# Patient Record
Sex: Male | Born: 1945 | Hispanic: No | Marital: Married | Smoking: Never smoker
Health system: Southern US, Community
[De-identification: ages and names within clinical notes are randomized; demographics above are authoritative.]

## PROBLEM LIST (undated history)

## (undated) DIAGNOSIS — R51 Headache: Secondary | ICD-10-CM

## (undated) DIAGNOSIS — K219 Gastro-esophageal reflux disease without esophagitis: Secondary | ICD-10-CM

## (undated) DIAGNOSIS — I10 Essential (primary) hypertension: Secondary | ICD-10-CM

## (undated) DIAGNOSIS — Z8489 Family history of other specified conditions: Secondary | ICD-10-CM

## (undated) DIAGNOSIS — E785 Hyperlipidemia, unspecified: Secondary | ICD-10-CM

## (undated) DIAGNOSIS — E119 Type 2 diabetes mellitus without complications: Secondary | ICD-10-CM

## (undated) DIAGNOSIS — M1990 Unspecified osteoarthritis, unspecified site: Secondary | ICD-10-CM

## (undated) DIAGNOSIS — R519 Headache, unspecified: Secondary | ICD-10-CM

## (undated) HISTORY — PX: OTHER SURGICAL HISTORY: SHX169

## (undated) HISTORY — DX: Hyperlipidemia, unspecified: E78.5

## (undated) HISTORY — DX: Essential (primary) hypertension: I10

## (undated) HISTORY — PX: CATARACT EXTRACTION W/ INTRAOCULAR LENS IMPLANT: SHX1309

## (undated) HISTORY — PX: VASECTOMY: SHX75

## (undated) HISTORY — PX: KNEE ARTHROSCOPY: SHX127

## (undated) HISTORY — DX: Type 2 diabetes mellitus without complications: E11.9

---

## 2009-12-25 ENCOUNTER — Emergency Department (HOSPITAL_COMMUNITY): Admission: EM | Admit: 2009-12-25 | Discharge: 2009-12-26 | Payer: Self-pay | Admitting: Emergency Medicine

## 2010-10-09 LAB — CBC
HCT: 48.4 % (ref 39.0–52.0)
Hemoglobin: 16.6 g/dL (ref 13.0–17.0)
MCHC: 34.2 g/dL (ref 30.0–36.0)
Platelets: 191 10*3/uL (ref 150–400)
RDW: 12.6 % (ref 11.5–15.5)
WBC: 7.2 10*3/uL (ref 4.0–10.5)

## 2010-10-09 LAB — URINE CULTURE
Colony Count: NO GROWTH
Culture: NO GROWTH

## 2010-10-09 LAB — COMPREHENSIVE METABOLIC PANEL
ALT: 79 U/L — ABNORMAL HIGH (ref 0–53)
AST: 62 U/L — ABNORMAL HIGH (ref 0–37)
CO2: 25 mEq/L (ref 19–32)
Creatinine, Ser: 1.35 mg/dL (ref 0.4–1.5)
GFR calc Af Amer: >60 mL/min (ref >60)
GFR calc non Af Amer: 53 mL/min — ABNORMAL LOW (ref >60)
Sodium: 131 mEq/L — ABNORMAL LOW (ref 135–145)
Total Protein: 8.2 g/dL (ref 6.0–8.3)

## 2010-10-09 LAB — DIFFERENTIAL
Basophils Absolute: 0 10*3/uL (ref 0.0–0.1)
Monocytes Absolute: 0.8 10*3/uL (ref 0.1–1.0)

## 2010-10-09 LAB — URINALYSIS, ROUTINE W REFLEX MICROSCOPIC
Hgb urine dipstick: NEGATIVE
Protein, ur: NEGATIVE mg/dL
Urobilinogen, UA: 0.2 mg/dL (ref 0.0–1.0)

## 2011-10-29 ENCOUNTER — Telehealth: Payer: Self-pay

## 2011-10-29 NOTE — Telephone Encounter (Signed)
Pt states he put in a request for his medical records to be picked - up and he has not heard anything please contact pt when this is done

## 2011-10-30 NOTE — Telephone Encounter (Signed)
Spoke with patient this morning. Requested records twice due to switching doctors. Will copy for pick up this afternoon and will call at [phone] when ready for pickup.

## 2014-12-02 ENCOUNTER — Encounter: Payer: Medicare Other | Attending: Family Medicine

## 2014-12-02 VITALS — Ht 69.5 in | Wt 280.1 lb

## 2014-12-02 DIAGNOSIS — Z713 Dietary counseling and surveillance: Secondary | ICD-10-CM | POA: Diagnosis not present

## 2014-12-02 DIAGNOSIS — E119 Type 2 diabetes mellitus without complications: Secondary | ICD-10-CM

## 2014-12-07 NOTE — Progress Notes (Signed)

## 2014-12-09 DIAGNOSIS — E119 Type 2 diabetes mellitus without complications: Secondary | ICD-10-CM | POA: Diagnosis not present

## 2014-12-09 NOTE — Progress Notes (Signed)

## 2014-12-29 NOTE — Progress Notes (Signed)
Patient was seen on 12/16/14 for the third of a series of three diabetes self-management courses at the [facility].   Kevin Jones. State the amount of activity recommended for healthy living . Describe activities suitable for individual needs . Identify ways to regularly incorporate activity into daily life . Identify barriers to activity and ways to over come these barriers  Identify diabetes medications being personally used and their primary action for lowering glucose and possible side effects . Describe role of stress on blood glucose and develop strategies to address psychosocial issues . Identify diabetes complications and ways to prevent them  Explain how to manage diabetes during illness . Evaluate success in meeting personal goal . Establish 2-3 goals that they will plan to diligently work on until they return for the  720-month follow-up visit  Goals:   I will count my carb choices at most meals and snacks  I will be active 45 minutes or more 6 times a week  I will take my diabetes medications as scheduled  I will eat less unhealthy fats by eating less   I will look at patterns in my record book at least 1 days a week  To help manage stress I will  talking at least 7 times a week  Your patient has identified these potential barriers to change:  Lack of k nowledge  Your patient has identified their diabetes self-care support plan as  [facility] Family Support Plan:  Attend Core 4 in 4 months

## 2015-01-06 ENCOUNTER — Encounter: Payer: Self-pay | Admitting: *Deleted

## 2015-01-06 ENCOUNTER — Encounter: Payer: Medicare Other | Attending: Family Medicine | Admitting: *Deleted

## 2015-01-06 VITALS — Ht 69.5 in | Wt 276.6 lb

## 2015-01-06 DIAGNOSIS — E119 Type 2 diabetes mellitus without complications: Secondary | ICD-10-CM | POA: Diagnosis not present

## 2015-01-06 DIAGNOSIS — Z713 Dietary counseling and surveillance: Secondary | ICD-10-CM | POA: Insufficient documentation

## 2015-01-06 NOTE — Patient Instructions (Signed)
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

## 2015-01-06 NOTE — Progress Notes (Signed)
Diabetes Self-Management Education  Visit Type:  Follow-up  Appt. Start Time: 1030 Appt. End Time: 1100  01/06/2015  Kevin Jones, identified by name and date of birth, is a 69 y.o. male with a diagnosis of Diabetes: Type 2.  Other people present during visit:  Patient   ASSESSMENT  Height 5' 9.5" (1.765 m), weight 276 lb 9.6 oz (125.465 kg). Body mass index is 40.27 kg/(m^2).    Subsequent Visit Information:  Since your last visit, have you continued or began the use of a meal plan?: Yes How many days a week are you following a meal plan?: 7 Since your last visit, have you continued or began to exercise on a consistent basis?: Yes How many days per week are you exercising or participating in a physicial activity for more than 20 minutes?: 4 Since your last visit have you continued or begun to take your medications as prescribed?: Yes Since your last visit have you experienced any weight changes?: Loss Weight Loss (lbs): 4 Since your last visit, are you checking your blood glucose at least once a day?: N/A (MD does not want him to check yet)  Psychosocial:     Patient Belief/Attitude about Diabetes: Motivated to manage diabetes Self-care barriers: None Self-management support: Family Other persons present: Patient Patient Concerns: Nutrition/Meal planning Special Needs: None Learning Readiness: Change in progress  Complications:   Last HgB A1C per patient/outside source: 6 mg/dL How often do you check your blood sugar?: 0 times/day (not testing)  Diet Intake:     Exercise:  Exercise: Light (walking / raking leaves) (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  Patient education plan per assessed needs and concerns is to attend individual  sessions for    Education Topics Reviewed with Patient Today:    Carbohydrate counting, Food label reading, portion sizes and measuring food.     Identified appropriate SMBG and/or A1C goals.            PATIENTS GOALS/Plan (Developed by the patient):  Nutrition: Follow meal plan discussed Physical Activity: Exercise 3-5 times per week Medications: take my medication as prescribed Monitoring : Not Applicable  Patient Self Evaluation of Goals - Patient rates self as meeting previously set goals:   Nutrition: >75% Physical Activity: >75% Medications: >75% Monitoring: Not Applicable Problem Solving: >75% Reducing Risk: >75% Health Coping: >75%   Plan:   Patient Instructions  Plan:  Aim for 3 Carb Choices per meal (45 grams) +/- 1 either way  Aim for 0-2 Carbs per snack if hungry  Include protein in moderation with your meals and snacks Consider reading food labels for Total Carbohydrate of foods 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 Expect positive outcomes  Education material provided: A1C conversion sheet  If problems or questions, patient to contact team via:  Phone and Email  Future DSME appointment: - 3-4 months

## 2015-04-20 ENCOUNTER — Encounter: Payer: Medicare Other | Attending: Family Medicine

## 2015-04-20 VITALS — Wt 262.6 lb

## 2015-04-20 DIAGNOSIS — E119 Type 2 diabetes mellitus without complications: Secondary | ICD-10-CM | POA: Diagnosis not present

## 2015-04-20 DIAGNOSIS — Z713 Dietary counseling and surveillance: Secondary | ICD-10-CM | POA: Insufficient documentation

## 2015-04-20 NOTE — Progress Notes (Signed)
Appt start time: 0900 end time:  1000.  Patient was seen on 04/20/2015 for a review of the series of three diabetes self-management courses at the [facility]. The following learning objectives were met by the patient during this class:  . Reviewed blood glucose monitoring and interpretation including the recommended target ranges and Hgb A1c.  . Reviewed on carb counting, importance of regularly scheduled meals/snacks, and meal planning.  . Reviewed the effects of physical activity on glucose levels and long-term glucose control.  Recommended goal of 150 minutes of physical activity/week. . Reviewed patient medications and discussed role of medication on blood glucose and possible side effects. . Discussed strategies to manage stress, psychosocial issues, and other obstacles to diabetes management. . Encouraged moderate weight reduction to improve glucose levels.   . Reviewed short-term complications: hyper- and hypo-glycemia.  Discussed causes, symptoms, and treatment options. . Reviewed prevention, detection, and treatment of long-term complications.  Discussed the role of prolonged elevated glucose levels on body systems.  Goals:  Follow Diabetes Meal Plan as instructed  Eat 3 meals and 2 snacks, every 3-5 hrs  Limit carbohydrate intake to 45 grams carbohydrate/meal Limit carbohydrate intake to 15 grams carbohydrate/snack Add lean protein foods to meals/snacks  Monitor glucose levels as instructed by your doctor  Aim for goal of 15-30 mins of physical activity daily as tolerated  Bring food record and glucose log to your next nutrition visit

## 2016-01-14 ENCOUNTER — Observation Stay (HOSPITAL_COMMUNITY)
Admission: EM | Admit: 2016-01-14 | Discharge: 2016-01-15 | Disposition: A | Discharge status: Home / Self Care | Payer: Medicare Other | Attending: Internal Medicine | Admitting: Internal Medicine

## 2016-01-14 ENCOUNTER — Other Ambulatory Visit: Payer: Self-pay

## 2016-01-14 ENCOUNTER — Encounter (HOSPITAL_COMMUNITY): Payer: Self-pay

## 2016-01-14 ENCOUNTER — Emergency Department (HOSPITAL_COMMUNITY): Payer: Medicare Other

## 2016-01-14 DIAGNOSIS — R0789 Other chest pain: Secondary | ICD-10-CM | POA: Diagnosis not present

## 2016-01-14 DIAGNOSIS — N183 Chronic kidney disease, stage 3 unspecified: Secondary | ICD-10-CM | POA: Diagnosis present

## 2016-01-14 DIAGNOSIS — Z7982 Long term (current) use of aspirin: Secondary | ICD-10-CM | POA: Diagnosis not present

## 2016-01-14 DIAGNOSIS — M1A9XX0 Chronic gout, unspecified, without tophus (tophi): Secondary | ICD-10-CM | POA: Diagnosis present

## 2016-01-14 DIAGNOSIS — Z7984 Long term (current) use of oral hypoglycemic drugs: Secondary | ICD-10-CM | POA: Insufficient documentation

## 2016-01-14 DIAGNOSIS — E785 Hyperlipidemia, unspecified: Secondary | ICD-10-CM | POA: Insufficient documentation

## 2016-01-14 DIAGNOSIS — E119 Type 2 diabetes mellitus without complications: Secondary | ICD-10-CM | POA: Diagnosis not present

## 2016-01-14 DIAGNOSIS — Z79899 Other long term (current) drug therapy: Secondary | ICD-10-CM | POA: Insufficient documentation

## 2016-01-14 DIAGNOSIS — R079 Chest pain, unspecified: Secondary | ICD-10-CM

## 2016-01-14 DIAGNOSIS — R0602 Shortness of breath: Secondary | ICD-10-CM | POA: Diagnosis not present

## 2016-01-14 DIAGNOSIS — I129 Hypertensive chronic kidney disease with stage 1 through stage 4 chronic kidney disease, or unspecified chronic kidney disease: Secondary | ICD-10-CM | POA: Insufficient documentation

## 2016-01-14 DIAGNOSIS — I10 Essential (primary) hypertension: Secondary | ICD-10-CM | POA: Diagnosis present

## 2016-01-14 LAB — CBC WITH DIFFERENTIAL/PLATELET
BASOS ABS: 0 10*3/uL (ref 0.0–0.1)
Basophils Relative: 0 %
Eosinophils Absolute: 0.5 10*3/uL (ref 0.0–0.7)
Eosinophils Relative: 5 %
HEMATOCRIT: 43.4 % (ref 39.0–52.0)
HEMOGLOBIN: 14.8 g/dL (ref 13.0–17.0)
LYMPHS ABS: 1.9 10*3/uL (ref 0.7–4.0)
LYMPHS PCT: 21 %
MCH: 29.8 pg (ref 26.0–34.0)
MCHC: 34.1 g/dL (ref 30.0–36.0)
MCV: 87.3 fL (ref 78.0–100.0)
Monocytes Absolute: 0.9 10*3/uL (ref 0.1–1.0)
Monocytes Relative: 10 %
NEUTROS ABS: 5.7 10*3/uL (ref 1.7–7.7)
NEUTROS PCT: 64 %
Platelets: 184 10*3/uL (ref 150–400)
RBC: 4.97 MIL/uL (ref 4.22–5.81)
RDW: 14.1 % (ref 11.5–15.5)
WBC: 8.9 10*3/uL (ref 4.0–10.5)

## 2016-01-14 LAB — BASIC METABOLIC PANEL WITH GFR
Anion gap: 12 (ref 5–15)
BUN: 35 mg/dL — ABNORMAL HIGH (ref 6–20)
CO2: 23 mmol/L (ref 22–32)
Calcium: 9.4 mg/dL (ref 8.9–10.3)
Chloride: 101 mmol/L (ref 101–111)
Creatinine, Ser: 1.36 mg/dL — ABNORMAL HIGH (ref 0.61–1.24)
GFR calc Af Amer: 60 mL/min — ABNORMAL LOW
GFR calc non Af Amer: 52 mL/min — ABNORMAL LOW
Glucose, Bld: 83 mg/dL (ref 65–99)
Potassium: 3.9 mmol/L (ref 3.5–5.1)
Sodium: 136 mmol/L (ref 135–145)

## 2016-01-14 LAB — TROPONIN I: Troponin I: <0.03 ng/mL (ref <0.031)

## 2016-01-14 MED ORDER — ACETAMINOPHEN 325 MG PO TABS
650.0000 mg | ORAL_TABLET | Freq: Once | ORAL | Status: AC
  Administered 2016-01-14: 650 mg via ORAL
  Filled 2016-01-14: qty 2

## 2016-01-14 MED ORDER — NITROGLYCERIN 0.4 MG SL SUBL
0.4000 mg | SUBLINGUAL_TABLET | SUBLINGUAL | Status: DC | PRN
  Administered 2016-01-14: 0.4 mg via SUBLINGUAL
  Filled 2016-01-14: qty 1

## 2016-01-14 MED ORDER — SODIUM CHLORIDE 0.9 % IV BOLUS (SEPSIS)
500.0000 mL | Freq: Once | INTRAVENOUS | Status: AC
  Administered 2016-01-14: 500 mL via INTRAVENOUS

## 2016-01-14 NOTE — ED Notes (Signed)
Per Pt he was sitting the computer about 1830 and had sudden onset of left side chest pain with radiation into left arm; pt denies n/v or diaphoriese; Pt states he did have a little bit of SOB at the same time; Pt rate pain at 5/10 that has stayed the same; pt has hx of DM, hyperlipidemia, and HTN; pt a&ox4 on arrival; pt is ambularoty on arrival. Md at bedside. Pt had 324 mg ASA and 1 nitro in route with not change in pain in chest but pain arm has decreased.

## 2016-01-14 NOTE — ED Notes (Signed)
Pt had to use the bathroom, prior to the EKG.

## 2016-01-14 NOTE — ED Provider Notes (Addendum)
CSN: [id number]     Arrival date & time    History   First MD Initiated Contact with Patient 01/14/16 2143     Chief Complaint  Patient presents with  . Chest Pain     (Consider location/radiation/quality/duration/timing/severity/associated sxs/prior Treatment) HPI  70 year old male presents with Left-sided chest pains. Started about 2 hours ago. Patient states he was sitting in his desk doing nothing special and all of a sudden developed the pain. Felt like an aching like he pulled a muscle. Patient states initially he had shortness of breath but that is gone. He also had aching pain in his left upper arm. He was given 324 mg aspirin and one nitroglycerin. Arm pain is gone but his chest pain is still a 5/10. No pleuritic pain. No back pain or abdominal pain.  Past Medical History  Diagnosis Date  . Hypertension   . Hyperlipidemia   . Diabetes mellitus without complication    No past surgical history on file. No family history on file. Social History  Substance Use Topics  . Smoking status: Not on file  . Smokeless tobacco: Not on file  . Alcohol Use: Not on file    Review of Systems  Constitutional: Negative for fever and diaphoresis.  Respiratory: Positive for shortness of breath.   Cardiovascular: Positive for chest pain.  Gastrointestinal: Negative for nausea and vomiting.  Musculoskeletal:       Left upper arm pain  All other systems reviewed and are negative.     Allergies  Compazine; Sulfa antibiotics; and Sulfazine  Home Medications   Prior to Admission medications   Medication Sig Start Date End Date Taking? Authorizing Provider  allopurinol (ZYLOPRIM) 300 MG tablet Take 450 mg by mouth daily.    Historical Provider, MD  amLODipine (NORVASC) 10 MG tablet Take 10 mg by mouth daily.    Historical Provider, MD  aspirin 81 MG tablet Take 81 mg by mouth daily.    Historical Provider, MD  atenolol (TENORMIN) 50 MG tablet Take 50 mg by mouth daily.    Historical  Provider, MD  lisinopril-hydrochlorothiazide (PRINZIDE,ZESTORETIC) 20-12.5 MG per tablet Take 1 tablet by mouth daily.    Historical Provider, MD  metFORMIN (GLUCOPHAGE) 500 MG tablet Take 500 mg by mouth 2 (two) times daily with a meal.    Historical Provider, MD  simvastatin (ZOCOR) 10 MG tablet Take 10 mg by mouth daily.    Historical Provider, MD   BP 158/79 mmHg  Pulse 80  Temp(Src) 97.7 F (36.5 C) (Oral)  Resp 16  SpO2 95% Physical Exam  Constitutional: He is oriented to person, place, and time. He appears well-developed and well-nourished. No distress.  HENT:  Head: Normocephalic and atraumatic.  Right Ear: External ear normal.  Left Ear: External ear normal.  Nose: Nose normal.  Eyes: Right eye exhibits no discharge. Left eye exhibits no discharge.  Neck: Neck supple.  Cardiovascular: Normal rate, regular rhythm, normal heart sounds and intact distal pulses.   Pulses:      Radial pulses are 2+ on the right side, and 2+ on the left side.  Pulmonary/Chest: Effort normal and breath sounds normal. He exhibits no tenderness.  Abdominal: Soft. He exhibits no distension. There is no tenderness.  Musculoskeletal: He exhibits no edema.  Neurological: He is alert and oriented to person, place, and time.  Skin: Skin is warm and dry. He is not diaphoretic.  Nursing note and vitals reviewed.   ED Course  Procedures (including critical  care time) Labs Review Labs Reviewed  BASIC METABOLIC PANEL - Abnormal; Notable for the following:    BUN 35 (*)    Creatinine, Ser 1.36 (*)    GFR calc non Af Amer 52 (*)    GFR calc Af Amer 60 (*)    All other components within normal limits  CBC WITH DIFFERENTIAL/PLATELET  TROPONIN I    Imaging Review Dg Chest 2 View  01/14/2016  CLINICAL DATA:  Left-sided chest pain and shortness of breath EXAM: CHEST  2 VIEW COMPARISON:  None. FINDINGS: Mild opacity in left lung base is somewhat platelike suggesting atelectasis. No pneumothorax. No  pulmonary nodules or masses. No other infiltrates. The heart, hila, and mediastinum are unremarkable. IMPRESSION: Mild opacity in the left lung base. Atelectasis is considered more likely than developing infiltrate. Recommend follow-up as clinically warranted. Electronically Signed   By: Gerome Samavid  Williams III M.D   On: 01/14/2016 22:44   I have personally reviewed and evaluated these images and lab results as part of my medical decision-making.   EKG Interpretation   Date/Time:  Saturday January 14 2016 21:48:46 EDT Ventricular Rate:  75 PR Interval:    QRS Duration: 139 QT Interval:  411 QTC Calculation: 460 R Axis:   -142 Text Interpretation:  Sinus rhythm Prolonged PR interval Right bundle  branch block No old tracing to compare Confirmed by Dayan Desa MD, Alva Broxson  [phone]([id number]) on 01/14/2016 9:56:41 PM      MDM   Final diagnoses:  Chest pain, unspecified chest pain type    Patient with atypical chest pain. Pain is now resolved, unclear if from nitro or on its own. ECG with RBBB with no old. Initial troponin negative. Doubt PE. HEART score is a 5. No cough, likely CXR findings is atelectasis. Will admit to hospitalist for ACS rule out.    Pricilla LovelessScott Bellami Farrelly, MD 01/14/16 [id number]  Pricilla LovelessScott Hiilani Jetter, MD 01/15/16 0040

## 2016-01-15 ENCOUNTER — Observation Stay (HOSPITAL_COMMUNITY): Payer: Medicare Other

## 2016-01-15 ENCOUNTER — Other Ambulatory Visit: Payer: Self-pay

## 2016-01-15 ENCOUNTER — Other Ambulatory Visit (HOSPITAL_COMMUNITY): Payer: Self-pay

## 2016-01-15 ENCOUNTER — Encounter (HOSPITAL_COMMUNITY): Payer: Self-pay | Admitting: Family Medicine

## 2016-01-15 DIAGNOSIS — E785 Hyperlipidemia, unspecified: Secondary | ICD-10-CM | POA: Diagnosis not present

## 2016-01-15 DIAGNOSIS — R079 Chest pain, unspecified: Secondary | ICD-10-CM

## 2016-01-15 DIAGNOSIS — M1A9XX0 Chronic gout, unspecified, without tophus (tophi): Secondary | ICD-10-CM | POA: Diagnosis present

## 2016-01-15 DIAGNOSIS — R0789 Other chest pain: Principal | ICD-10-CM

## 2016-01-15 DIAGNOSIS — N183 Chronic kidney disease, stage 3 (moderate): Secondary | ICD-10-CM | POA: Diagnosis not present

## 2016-01-15 DIAGNOSIS — M1040 Other secondary gout, unspecified site: Secondary | ICD-10-CM

## 2016-01-15 DIAGNOSIS — I10 Essential (primary) hypertension: Secondary | ICD-10-CM

## 2016-01-15 DIAGNOSIS — I129 Hypertensive chronic kidney disease with stage 1 through stage 4 chronic kidney disease, or unspecified chronic kidney disease: Secondary | ICD-10-CM | POA: Diagnosis not present

## 2016-01-15 DIAGNOSIS — E119 Type 2 diabetes mellitus without complications: Secondary | ICD-10-CM | POA: Diagnosis not present

## 2016-01-15 LAB — NM MYOCAR MULTI W/SPECT W/WALL MOTION / EF
CHL CUP RESTING HR STRESS: 62 {beats}/min
CSEPPHR: 81 {beats}/min

## 2016-01-15 LAB — GLUCOSE, CAPILLARY
GLUCOSE-CAPILLARY: 144 mg/dL — AB (ref 65–99)
Glucose-Capillary: 100 mg/dL — ABNORMAL HIGH (ref 65–99)

## 2016-01-15 LAB — BRAIN NATRIURETIC PEPTIDE: B Natriuretic Peptide: 17.7 pg/mL (ref 0.0–100.0)

## 2016-01-15 LAB — TROPONIN I
Troponin I: <0.03 ng/mL (ref <0.031)
Troponin I: <0.03 ng/mL (ref <0.031)
Troponin I: <0.03 ng/mL (ref <0.031)

## 2016-01-15 MED ORDER — ALLOPURINOL 300 MG PO TABS
450.0000 mg | ORAL_TABLET | Freq: Every day | ORAL | Status: DC
  Administered 2016-01-15: 450 mg via ORAL
  Filled 2016-01-15: qty 2

## 2016-01-15 MED ORDER — TECHNETIUM TC 99M TETROFOSMIN IV KIT
30.0000 | PACK | Freq: Once | INTRAVENOUS | Status: AC | PRN
  Administered 2016-01-15: 30 via INTRAVENOUS

## 2016-01-15 MED ORDER — ONDANSETRON HCL 4 MG/2ML IJ SOLN: 4.0000 mg | Freq: Four times a day (QID) | INTRAMUSCULAR | Status: DC | PRN

## 2016-01-15 MED ORDER — HYDROCHLOROTHIAZIDE 25 MG PO TABS
25.0000 mg | ORAL_TABLET | Freq: Every day | ORAL | Status: DC
  Administered 2016-01-15: 25 mg via ORAL
  Filled 2016-01-15: qty 1

## 2016-01-15 MED ORDER — NITROGLYCERIN 0.4 MG SL SUBL: 0.4000 mg | SUBLINGUAL_TABLET | SUBLINGUAL | Status: DC | PRN

## 2016-01-15 MED ORDER — ASPIRIN EC 81 MG PO TBEC
81.0000 mg | DELAYED_RELEASE_TABLET | Freq: Every day | ORAL | Status: DC
  Administered 2016-01-15: 81 mg via ORAL
  Filled 2016-01-15: qty 1

## 2016-01-15 MED ORDER — ATENOLOL 50 MG PO TABS
50.0000 mg | ORAL_TABLET | Freq: Every day | ORAL | Status: DC
  Filled 2016-01-15: qty 1

## 2016-01-15 MED ORDER — REGADENOSON 0.4 MG/5ML IV SOLN
INTRAVENOUS | Status: AC
  Filled 2016-01-15: qty 5

## 2016-01-15 MED ORDER — LISINOPRIL-HYDROCHLOROTHIAZIDE 20-25 MG PO TABS: 1.0000 | ORAL_TABLET | Freq: Every day | ORAL | Status: DC

## 2016-01-15 MED ORDER — FLUTICASONE PROPIONATE 50 MCG/ACT NA SUSP
1.0000 | Freq: Every day | NASAL | Status: DC
  Administered 2016-01-15: 1 via NASAL
  Filled 2016-01-15: qty 16

## 2016-01-15 MED ORDER — TECHNETIUM TC 99M TETROFOSMIN IV KIT
10.0000 | PACK | Freq: Once | INTRAVENOUS | Status: AC | PRN
  Administered 2016-01-15: 10 via INTRAVENOUS

## 2016-01-15 MED ORDER — ACETAMINOPHEN 325 MG PO TABS: 650.0000 mg | ORAL_TABLET | ORAL | Status: DC | PRN

## 2016-01-15 MED ORDER — IBUPROFEN 400 MG PO TABS: 400.0000 mg | ORAL_TABLET | Freq: Three times a day (TID) | ORAL | Status: DC

## 2016-01-15 MED ORDER — ENOXAPARIN SODIUM 40 MG/0.4ML ~~LOC~~ SOLN
40.0000 mg | SUBCUTANEOUS | Status: DC
  Administered 2016-01-15: 40 mg via SUBCUTANEOUS
  Filled 2016-01-15 (×2): qty 0.4

## 2016-01-15 MED ORDER — SIMVASTATIN 10 MG PO TABS: 10.0000 mg | ORAL_TABLET | Freq: Every day | ORAL | Status: DC

## 2016-01-15 MED ORDER — AMLODIPINE BESYLATE 5 MG PO TABS
10.0000 mg | ORAL_TABLET | Freq: Every day | ORAL | Status: DC
  Administered 2016-01-15: 10 mg via ORAL
  Filled 2016-01-15: qty 2

## 2016-01-15 MED ORDER — REGADENOSON 0.4 MG/5ML IV SOLN
0.4000 mg | Freq: Once | INTRAVENOUS | Status: AC
  Administered 2016-01-15: 0.4 mg via INTRAVENOUS

## 2016-01-15 MED ORDER — LISINOPRIL 20 MG PO TABS
20.0000 mg | ORAL_TABLET | Freq: Every day | ORAL | Status: DC
  Administered 2016-01-15: 20 mg via ORAL
  Filled 2016-01-15: qty 1

## 2016-01-15 NOTE — Progress Notes (Signed)
Attempted to get report. 

## 2016-01-15 NOTE — Consult Note (Signed)
CARDIOLOGY CONSULT NOTE       Patient ID: Kevin Jones MRN: [id number] DOB/AGE: 09/19/1945 70 y.o.  Admit date: 01/14/2016 Referring Physician: Butler Denmarkizwan Primary Physician: Kevin Jones, STEPHEN, Kevin Jones Primary Cardiologist: Kevin Jones Reason for Consultation: Chest Pain  Principal Problem:   Chest pain Active Problems:   Hypertension   Hyperlipidemia   CKD (chronic kidney disease) stage 3, GFR 30-59 ml/min   Chest pain at rest   Chronic gout   HPI:  70 y.o. HTN, elevated lipids and DM.  Admitted with new onset SSCP.  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 Yesterday multiple episodes of left sided pain. Pressure Radiating to left shoulder Related to exertion but lingered when stopped. This am left shoulder pain that sounds more mechanical. Compliant with meds. No history of CAD Had  Normal stress [place] 6530[place]  In ER CXR ? Atelectasis. No CE. Enzymes negative and ECG no acute changes  Did not sleep well due to  Pain in iv on left hand and left shoulder pain   ROS All other systems reviewed and negative except as noted above  Past Medical History  Diagnosis Date  . Hypertension   . Hyperlipidemia   . Diabetes mellitus without complication (HCC)     Family History  Problem Relation Age of Onset  . Cirrhosis Sister   . Lung cancer Sister     Social History   Social History  . Marital Status: Married    Spouse Name: N/A  . Number of Children: N/A  . Years of Education: N/A   Occupational History  . Not on file.   Social History Main Topics  . Smoking status: Never Smoker   . Smokeless tobacco: Not on file  . Alcohol Use: Yes  . Drug Use: Not on file  . Sexual Activity: Not on file   Other Topics Concern  . Not on file   Social History Narrative    History reviewed. No pertinent past surgical history.   Marland Kitchen. allopurinol  450 mg Oral Daily  . amLODipine  10 mg Oral Daily  . aspirin EC  81 mg Oral Daily  . enoxaparin  (LOVENOX) injection  40 mg Subcutaneous Q24H  . fluticasone  1 spray Each Nare QHS  . lisinopril  20 mg Oral Daily   And  . hydrochlorothiazide  25 mg Oral Daily  . simvastatin  10 mg Oral q1800      Physical Exam: Blood pressure 129/70, pulse 61, temperature 97.7 F (36.5 C), temperature source Oral, resp. rate 16, height 5\' 9"  (1.753 m), weight 259 lb 11.2 oz (117.799 kg), SpO2 98 %.    Affect appropriate Healthy:  appears stated age HEENT: normal Neck supple with no adenopathy JVP normal no bruits no thyromegaly Lungs clear with no wheezing and good diaphragmatic motion Heart:  S1/S2 no murmur, no rub, gallop or click PMI normal Abdomen: benighn, BS positve, no tenderness, no AAA no bruit.  No HSM or HJR Distal pulses intact with no bruits No edema Neuro non-focal Skin warm and dry No muscular weakness   Labs:   Lab Results  Component Value Date   WBC 8.9 01/14/2016   HGB 14.8 01/14/2016   HCT 43.4 01/14/2016   MCV 87.3 01/14/2016   PLT 184 01/14/2016    Recent Labs Lab 01/14/16 2153  NA 136  K 3.9  CL 101  CO2 23  BUN 35*  CREATININE 1.36*  CALCIUM 9.4  GLUCOSE 83   Lab Results  Component Value Date   TROPONINI <0.03 01/15/2016     Radiology: Dg Chest 2 View  01/14/2016  CLINICAL DATA:  Left-sided chest pain and shortness of breath EXAM: CHEST  2 VIEW COMPARISON:  None. FINDINGS: Mild opacity in left lung base is somewhat platelike suggesting atelectasis. No pneumothorax. No pulmonary nodules or masses. No other infiltrates. The heart, hila, and mediastinum are unremarkable. IMPRESSION: Mild opacity in the left lung base. Atelectasis is considered more likely than developing infiltrate. Recommend follow-up as clinically warranted. Electronically Signed   By: Gerome Samavid  Williams III M.D   On: 01/14/2016 22:44    EKG:  NSR no acute changes    ASSESSMENT AND PLAN:  Chest Pain: Intermediate risk R/O have scheduled exercise myovue for this am Hold  atenolol HTN:  Well controlled.  Continue current medications and low sodium Dash type diet.   Chol:  On statin  DM:  Discussed low carb diet.  Target hemoglobin A1c is 6.5 or less.  Continue current medications.   SignedCharlton Haws: Aubra Pappalardo 01/15/2016, 7:41 AM

## 2016-01-15 NOTE — H&P (Signed)
History and Physical    Kevin Jones RUE:[id number] DOB: 03-01-1946 DOA: 01/14/2016  PCP: Joycelyn RuaMEYERS, STEPHEN, MD   Patient coming from: Home   Chief Complaint: Chest pain   HPI: Kevin Jones is a 70 y.o. male with medical history significant for hypertension, hyperlipidemia, and prior history of type 2 diabetes mellitus now with normal A1c after lifestyle change, presenting to the ED with acute onset of left-sided chest pain with radiation to the left arm. Patient reports pain in his usual state of health on the night of his presentation and was sitting at a computer when he developed acute onset of pain in the left chest described as an ache, radiating down to the left arm, 5/10 in severity, associated with mild transient dyspnea, but no nausea or diaphoresis. Patient has never had similar symptoms previously, denies history of MI, and notes that he had a normal stress test approximately 9-10 years ago. Patient has never smoked, exercises daily, and denies family history of premature heart disease though his father died of trauma at early age and his 2 sisters also died relatively young of noncardiac causes. EMS was activated for transport to the hospital and a full dose aspirin and nitroglycerin were administered en route. Patient's pain had resolved prior to his arrival in the ED.  ED Course: Upon arrival to the ED, patient is found to be afebrile, saturating well on room air, and with vital signs stable. 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 There is no prior EKG for comparison. Chest x-ray features a mild opacity in the left base which likely represents atelectasis. BMP is notable for a BUN of 35 and serum creatinine 1.36 which appears stable relative to the prior measurement. CBC is unremarkable and initial troponin is <0.03. Patient has remained pain free in the emergency department, but given his age and comorbidity, including hypertension, hyperlipidemia, and prior  diabetes, he will be observed on telemetry unit for ongoing evaluation and management of chest pain concerning for possible ACS.  Review of Systems:  All other systems reviewed and apart from HPI, are negative.  Past Medical History  Diagnosis Date  . Hypertension   . Hyperlipidemia   . Diabetes mellitus without complication (HCC)     History reviewed. No pertinent past surgical history.   reports that he has never smoked. He does not have any smokeless tobacco history on file. He reports that he drinks alcohol. His drug history is not on file.  Allergies  Allergen Reactions  . Compazine [Prochlorperazine Edisylate] Rash    Muscle contractions  . Sulfa Antibiotics Rash  . Sulfazine [Sulfasalazine] Rash    Family History  Problem Relation Age of Onset  . Cirrhosis Sister   . Lung cancer Sister      Prior to Admission medications   Medication Sig Start Date End Date Taking? Authorizing Provider  allopurinol (ZYLOPRIM) 300 MG tablet Take 450 mg by mouth daily.   Yes Historical Provider, MD  amLODipine (NORVASC) 10 MG tablet Take 10 mg by mouth daily.   Yes Historical Provider, MD  aspirin 81 MG tablet Take 81 mg by mouth every morning.    Yes Historical Provider, MD  atenolol (TENORMIN) 50 MG tablet Take 50 mg by mouth daily.   Yes Historical Provider, MD  fluticasone (FLONASE) 50 MCG/ACT nasal spray Place 1 spray into both nostrils at bedtime.  12/16/15  Yes Historical Provider, MD  lisinopril-hydrochlorothiazide (PRINZIDE,ZESTORETIC) 20-12.5 MG per tablet Take 1 tablet  by mouth daily.   Yes Historical Provider, MD  metFORMIN (GLUCOPHAGE) 500 MG tablet Take 500 mg by mouth 2 (two) times daily with a meal.   Yes Historical Provider, MD  simvastatin (ZOCOR) 10 MG tablet Take 10 mg by mouth daily at 6 PM.    Yes Historical Provider, MD  lisinopril-hydrochlorothiazide (PRINZIDE,ZESTORETIC) 20-25 MG tablet Take 1 tablet by mouth daily. 12/08/15   Historical Provider, MD    Physical  Exam: Filed Vitals:   01/14/16 2147 01/14/16 2153 01/14/16 2155 01/14/16 2200  BP:  158/79  142/64  Pulse:  80  71  Temp:  97.7 F (36.5 C)    TempSrc:  Oral    Resp:  16  14  SpO2: 94% 95% 96% 93%      Constitutional: NAD, calm, comfortable Eyes: PERTLA, lids and conjunctivae normal ENMT: Mucous membranes are moist. Posterior pharynx clear of any exudate or lesions.   Neck: normal, supple, no masses, no thyromegaly Respiratory: clear to auscultation bilaterally, no wheezing, no crackles. Normal respiratory effort.   Cardiovascular: S1 & S2 heard, regular rate and rhythm, no significant murmurs / rubs / gallops. No extremity edema. 2+ pedal pulses.  Abdomen: No distension, no tenderness, no masses palpated. Bowel sounds normal.  Musculoskeletal: no clubbing / cyanosis. No joint deformity upper and lower extremities. Normal muscle tone.  Skin: no significant rashes, lesions, ulcers. Warm, dry, well-perfused. Neurologic: CN 2-12 grossly intact. Sensation intact, DTR normal. Strength 5/5 in all 4 limbs.  Psychiatric: Normal judgment and insight. Alert and oriented x 3. Normal mood and affect.     Labs on Admission: I have personally reviewed following labs and imaging studies  CBC:  Recent Labs Lab 01/14/16 2153  WBC 8.9  NEUTROABS 5.7  HGB 14.8  HCT 43.4  MCV 87.3  PLT 184   Basic Metabolic Panel:  Recent Labs Lab 01/14/16 2153  NA 136  K 3.9  CL 101  CO2 23  GLUCOSE 83  BUN 35*  CREATININE 1.36*  CALCIUM 9.4   GFR: CrCl cannot be calculated (Unknown ideal weight.). Liver Function Tests: No results for input(s): AST, ALT, ALKPHOS, BILITOT, PROT, ALBUMIN in the last 168 hours. No results for input(s): LIPASE, AMYLASE in the last 168 hours. No results for input(s): AMMONIA in the last 168 hours. Coagulation Profile: No results for input(s): INR, PROTIME in the last 168 hours. Cardiac Enzymes:  Recent Labs Lab 01/14/16 2153  TROPONINI <0.03   BNP  (last 3 results) No results for input(s): PROBNP in the last 8760 hours. HbA1C: No results for input(s): HGBA1C in the last 72 hours. CBG: No results for input(s): GLUCAP in the last 168 hours. Lipid Profile: No results for input(s): CHOL, HDL, LDLCALC, TRIG, CHOLHDL, LDLDIRECT in the last 72 hours. Thyroid Function Tests: No results for input(s): TSH, T4TOTAL, FREET4, T3FREE, THYROIDAB in the last 72 hours. Anemia Panel: No results for input(s): VITAMINB12, FOLATE, FERRITIN, TIBC, IRON, RETICCTPCT in the last 72 hours. Urine analysis:    Component Value Date/Time   COLORURINE YELLOW 12/25/2009 2134   APPEARANCEUR CLOUDY* 12/25/2009 2134   LABSPEC 1.012 12/25/2009 2134   PHURINE 6.0 12/25/2009 2134   GLUCOSEU NEGATIVE 12/25/2009 2134   HGBUR NEGATIVE 12/25/2009 2134   BILIRUBINUR NEGATIVE 12/25/2009 2134   KETONESUR NEGATIVE 12/25/2009 2134   PROTEINUR NEGATIVE 12/25/2009 2134   UROBILINOGEN 0.2 12/25/2009 2134   NITRITE NEGATIVE 12/25/2009 2134   LEUKOCYTESUR  12/25/2009 2134    NEGATIVE MICROSCOPIC NOT DONE ON URINES  WITH NEGATIVE PROTEIN, BLOOD, LEUKOCYTES, NITRITE, OR GLUCOSE <1000 mg/dL.   Sepsis Labs: @LABRCNTIP (procalcitonin:4,lacticidven:4) )No results found for this or any previous visit (from the past 240 hour(s)).   Radiological Exams on Admission: Dg Chest 2 View  01/14/2016  CLINICAL DATA:  Left-sided chest pain and shortness of breath EXAM: CHEST  2 VIEW COMPARISON:  None. FINDINGS: Mild opacity in left lung base is somewhat platelike suggesting atelectasis. No pneumothorax. No pulmonary nodules or masses. No other infiltrates. The heart, hila, and mediastinum are unremarkable. IMPRESSION: Mild opacity in the left lung base. Atelectasis is considered more likely than developing infiltrate. Recommend follow-up as clinically warranted. Electronically Signed   By: Gerome Samavid  Williams III M.D   On: 01/14/2016 22:44    EKG: Independently reviewed. Sinus rhythm, first degree  AV block, RBBB, no prior for comparison   Assessment/Plan  1. Chest pain  - Atypical in that it came on at rest, but concerning in that it was relieved with NTG  - After admission, pt has noted that he can reproduce sxs with certain arm movements, suggesting a MSK etiology, though he has not done any new activities with the UE's  - Initial EKG features abnormalities that are likely chronic, but there is no prior for comparison; will repeat in am, sooner for CP recurrence  - Initial troponin <0.03, will obtain serial measurements  - Monitor on telemetry for ischemic changes  - ASA 324 mg given en route; continue his beta-blocker and statin    2. Hypertension  - BP at goal at time of admission  - Continue current management with lisinopril, HCTZ, Norvasc, atenolol   3. Hyperlipidemia  - No lipid profile in EMR  - Continue current management with Zocor    4. CKD stage III  - SCr 1.36, stable relative to prior measurement  - Avoid nephrotoxins, renally-dose medications   5. Chronic gout - Stable; currently asymptomatic  - Continue allopurinol     DVT prophylaxis: sq Lovenox   Code Status: Full Family Communication: Daughter updated at bedside   Disposition Plan: Observe on telemetry   Consults called: None   Admission status: Observation     Briscoe Deutscherimothy S Avon Mergenthaler, MD [facility] Pager [phone]  If 7PM-7AM, please contact night-coverage [email or web address] Password [email or web address]  01/15/2016, 12:11 AM

## 2016-01-15 NOTE — Discharge Instructions (Signed)

## 2016-01-15 NOTE — Progress Notes (Signed)
     Kevin Jones

## 2016-01-15 NOTE — Progress Notes (Signed)
Patient being discharged per MD order/ All discharge instructions given verbally and written.

## 2016-01-19 NOTE — Progress Notes (Deleted)
Physician Discharge Summary  Kevin GriffithRoy Jones [id number] DOB: 1945/07/31 DOA: 01/14/2016  PCP: Joycelyn RuaMEYERS, STEPHEN, MD  Admit date: 01/14/2016 Discharge date: 01/19/2016  Admitted From: home  Disposition:  home   Recommendations for Outpatient Follow-up:  1. Follow up with PCP in 1-2 weeks  Home Health: none  Equipment/Devices:  none    Discharge Condition:  stable   CODE STATUS:  Full code   Diet recommendation:  Heart healthy Consultations:  Cardiology     Discharge Diagnoses:  Principal Problem:   Chest pain Active Problems:   Hypertension   Hyperlipidemia   CKD (chronic kidney disease) stage 3, GFR 30-59 ml/min   Chest pain at rest   Chronic gout    Subjective: No complaints of pain during stress test  Brief Summary: Kevin GriffithRoy Schermer is a 70 y.o. male with medical history significant for hypertension, hyperlipidemia, and prior history of type 2 diabetes mellitus now with normal A1c after lifestyle change, presenting to the ED with acute onset of left-sided chest pain with radiation to the left arm. Patient reports pain in his usual state of health on the night of his presentation and was sitting at a computer when he developed acute onset of pain in the left chest described as an ache, radiating down to the left arm, 5/10 in severity, associated with mild transient dyspnea, but no nausea or diaphoresis. Patient has never had similar symptoms previously, denies history of MI, and notes that he had a normal stress test approximately 9-10 years ago. Patient has never smoked, exercises daily, and denies family history of premature heart disease though his father died of trauma at early age and his 2 sisters also died relatively young of noncardiac causes. EMS was activated for transport to the hospital and a full dose aspirin and nitroglycerin were administered en route. Patient's pain had resolved prior to his arrival in the ED.  Hospital Course:  Chest pain -evaluated by cardiology- 3  sets of Troponin negative EKG unrevealing -  negative myoview stress test- pain appears to be muscular- recommended short course of Ibuprofen, ice and rest  CKD 3  - stable  DM 2 - cont Metformin and dietary control  HTN - cont current meds and dietary control  HLD - cont Statin  Discharge Instructions  Discharge Instructions    Diet - low sodium heart healthy    Complete by:  As directed      Increase activity slowly    Complete by:  As directed             Medication List    TAKE these medications        allopurinol 300 MG tablet  Commonly known as:  ZYLOPRIM  Take 450 mg by mouth daily.     amLODipine 10 MG tablet  Commonly known as:  NORVASC  Take 10 mg by mouth daily.     aspirin 81 MG tablet  Take 81 mg by mouth every morning.     atenolol 50 MG tablet  Commonly known as:  TENORMIN  Take 50 mg by mouth daily.     fluticasone 50 MCG/ACT nasal spray  Commonly known as:  FLONASE  Place 1 spray into both nostrils at bedtime.     ibuprofen 400 MG tablet  Commonly known as:  MOTRIN IB  Take 1-1.5 tablets (400-600 mg total) by mouth 4 (four) times daily -  with meals and at bedtime.     lisinopril-hydrochlorothiazide 20-12.5 MG tablet  Commonly known as:  PRINZIDE,ZESTORETIC  Take 1 tablet by mouth daily.     metFORMIN 500 MG tablet  Commonly known as:  GLUCOPHAGE  Take 500 mg by mouth 2 (two) times daily with a meal.     simvastatin 10 MG tablet  Commonly known as:  ZOCOR  Take 10 mg by mouth daily at 6 PM.        Allergies  Allergen Reactions  . Compazine [Prochlorperazine Edisylate] Rash    Muscle contractions  . Sulfa Antibiotics Rash  . Sulfazine [Sulfasalazine] Rash     Procedures/Studies: Myoview  Dg Chest 2 View  01/14/2016  CLINICAL DATA:  Left-sided chest pain and shortness of breath EXAM: CHEST  2 VIEW COMPARISON:  None. FINDINGS: Mild opacity in left lung base is somewhat platelike suggesting atelectasis. No pneumothorax. No  pulmonary nodules or masses. No other infiltrates. The heart, hila, and mediastinum are unremarkable. IMPRESSION: Mild opacity in the left lung base. Atelectasis is considered more likely than developing infiltrate. Recommend follow-up as clinically warranted. Electronically Signed   By: Gerome Sam III M.D   On: 01/14/2016 22:44   Nm Myocar Multi W/spect W/wall Motion / Ef  01/15/2016  CLINICAL DATA:  Chest pain, hypertension, hyperlipidemia and chronic kidney disease. EXAM: MYOCARDIAL IMAGING WITH SPECT (REST AND PHARMACOLOGIC-STRESS) GATED LEFT VENTRICULAR WALL MOTION STUDY LEFT VENTRICULAR EJECTION FRACTION TECHNIQUE: Standard myocardial SPECT imaging was performed after resting intravenous injection of 10 mCi Tc-77m tetrofosmin. Subsequently, intravenous infusion of Lexiscan was performed under the supervision of the Cardiology staff. At peak effect of the drug, 30 mCi Tc-17m tetrofosmin was injected intravenously and standard myocardial SPECT imaging was performed. Quantitative gated imaging was also performed to evaluate left ventricular wall motion, and estimate left ventricular ejection fraction. COMPARISON:  None. FINDINGS: Perfusion: No evidence of inducible myocardial ischemia. Mild attenuation of the inferior wall on both stress and rest acquisitions is felt to most likely represent diaphragmatic attenuation. Wall Motion: Normal left ventricular wall motion. No left ventricular dilation. Left Ventricular Ejection Fraction: 67 % End diastolic volume 101 ml End systolic volume 34 ml IMPRESSION: 1. No evidence of myocardial ischemia. Mild inferior wall attenuation is felt to represent diaphragmatic attenuation. 2. Normal left ventricular wall motion. 3. Left ventricular ejection fraction 67% 4. Non invasive risk stratification*: Low *2012 Appropriate Use Criteria for Coronary Revascularization Focused Update: J Am Coll Cardiol. 2012;59(9):857-881.  http://content.[email or web address]?[email or web address] Electronically Signed   By: Irish Lack M.D.   On: 01/15/2016 13:46        Discharge Exam: Filed Vitals:   01/15/16 1142 01/15/16 1354  BP: 139/69 117/54  Pulse: 70 59  Temp:  98.2 F (36.8 C)  Resp: 17 16   Filed Vitals:   01/15/16 1029 01/15/16 1141 01/15/16 1142 01/15/16 1354  BP:  139/69 139/69 117/54  Pulse: 72  70 59  Temp:    98.2 F (36.8 C)  TempSrc:    Oral  Resp:   17 16  Height:      Weight:      SpO2:   96% 94%    General: Pt is alert, awake, not in acute distress Cardiovascular: RRR, S1/S2 +, no rubs, no gallops Respiratory: CTA bilaterally, no wheezing, no rhonchi Abdominal: Soft, NT, ND, bowel sounds + Extremities: no edema, no cyanosis    The results of significant diagnostics from this hospitalization (including imaging, microbiology, ancillary and laboratory) are listed below for reference.     Microbiology: No results found for this or any previous visit (from the  past 240 hour(s)).   Labs: BNP (last 3 results)  Recent Labs  01/15/16 0001  BNP 17.7   Basic Metabolic Panel:  Recent Labs Lab 01/14/16 2153  NA 136  K 3.9  CL 101  CO2 23  GLUCOSE 83  BUN 35*  CREATININE 1.36*  CALCIUM 9.4   Liver Function Tests: No results for input(s): AST, ALT, ALKPHOS, BILITOT, PROT, ALBUMIN in the last 168 hours. No results for input(s): LIPASE, AMYLASE in the last 168 hours. No results for input(s): AMMONIA in the last 168 hours. CBC:  Recent Labs Lab 01/14/16 2153  WBC 8.9  NEUTROABS 5.7  HGB 14.8  HCT 43.4  MCV 87.3  PLT 184   Cardiac Enzymes:  Recent Labs Lab 01/14/16 2153 01/15/16 0017 01/15/16 0545 01/15/16 1150  TROPONINI <0.03 <0.03 <0.03 <0.03   BNP: Invalid input(s): POCBNP CBG:  Recent Labs Lab 01/15/16 0717 01/15/16 1147  GLUCAP 100* 144*   D-Dimer No results for input(s): DDIMER in the last 72 hours. Hgb A1c No results for input(s):  HGBA1C in the last 72 hours. Lipid Profile No results for input(s): CHOL, HDL, LDLCALC, TRIG, CHOLHDL, LDLDIRECT in the last 72 hours. Thyroid function studies No results for input(s): TSH, T4TOTAL, T3FREE, THYROIDAB in the last 72 hours.  Invalid input(s): FREET3 Anemia work up No results for input(s): VITAMINB12, FOLATE, FERRITIN, TIBC, IRON, RETICCTPCT in the last 72 hours. Urinalysis    Component Value Date/Time   COLORURINE YELLOW 12/25/2009 2134   APPEARANCEUR CLOUDY* 12/25/2009 2134   LABSPEC 1.012 12/25/2009 2134   PHURINE 6.0 12/25/2009 2134   GLUCOSEU NEGATIVE 12/25/2009 2134   HGBUR NEGATIVE 12/25/2009 2134   BILIRUBINUR NEGATIVE 12/25/2009 2134   KETONESUR NEGATIVE 12/25/2009 2134   PROTEINUR NEGATIVE 12/25/2009 2134   UROBILINOGEN 0.2 12/25/2009 2134   NITRITE NEGATIVE 12/25/2009 2134   LEUKOCYTESUR  12/25/2009 2134    NEGATIVE MICROSCOPIC NOT DONE ON URINES WITH NEGATIVE PROTEIN, BLOOD, LEUKOCYTES, NITRITE, OR GLUCOSE <1000 mg/dL.   Sepsis Labs Invalid input(s): PROCALCITONIN,  WBC,  LACTICIDVEN Microbiology No results found for this or any previous visit (from the past 240 hour(s)).   Time coordinating discharge: Over 30 minutes  SIGNED:   Calvert CantorIZWAN,Juanya Villavicencio, MD  [facility] 01/19/2016, 5:00 PM Pager   If 7PM-7AM, please contact night-coverage [email or web address] Password [email or web address]

## 2016-01-19 NOTE — Discharge Summary (Signed)
Physician Discharge Summary  Shown Dissinger ZOX:[id number] DOB: 06/01/1946 DOA: 01/14/2016  PCP: [name], MD  Admit date: 01/14/2016 Discharge date: 01/19/2016  Admitted From: home  Disposition:  home   Recommendations for Outpatient Follow-up:  1. Follow up with PCP in 1-2 weeks  Home Health: none  Equipment/Devices:  none    Discharge Condition:  stable   CODE STATUS:  Full code   Diet recommendation:  Heart healthy Consultations:  Cardiology     Discharge Diagnoses:  Principal Problem:   Chest pain at rest Active Problems:   Hypertension   Hyperlipidemia   CKD (chronic kidney disease) stage 3, GFR 30-59 ml/min   Chronic gout    Subjective: No complaints of pain during stress test  Brief Summary: Kevin Jones is a 70 y.o. male with medical history significant for hypertension, hyperlipidemia, and prior history of type 2 diabetes mellitus now with normal A1c after lifestyle change, presenting to the ED with acute onset of left-sided chest pain with radiation to the left arm. Patient reports pain in his usual state of health on the night of his presentation and was sitting at a computer when he developed acute onset of pain in the left chest described as an ache, radiating down to the left arm, 5/10 in severity, associated with mild transient dyspnea, but no nausea or diaphoresis. Patient has never had similar symptoms previously, denies history of MI, and notes that he had a normal stress test approximately 9-10 years ago. Patient has never smoked, exercises daily, and denies family history of premature heart disease though his father died of trauma at early age and his 2 sisters also died relatively young of noncardiac causes. EMS was activated for transport to the hospital and a full dose aspirin and nitroglycerin were administered en route. Patient's pain had resolved prior to his arrival in the ED.  Hospital Course:  Chest pain -evaluated by cardiology- 3 sets of  Troponin negative EKG unrevealing -  negative myoview stress test- pain appears to be muscular- recommended short course of Ibuprofen, ice and rest  CKD 3  - stable  DM 2 - cont Metformin and dietary control  HTN - cont current meds and dietary control  HLD - cont Statin  Discharge Instructions      Discharge Instructions    Diet - low sodium heart healthy    Complete by:  As directed      Increase activity slowly    Complete by:  As directed             Medication List    TAKE these medications        allopurinol 300 MG tablet  Commonly known as:  ZYLOPRIM  Take 450 mg by mouth daily.     amLODipine 10 MG tablet  Commonly known as:  NORVASC  Take 10 mg by mouth daily.     aspirin 81 MG tablet  Take 81 mg by mouth every morning.     atenolol 50 MG tablet  Commonly known as:  TENORMIN  Take 50 mg by mouth daily.     fluticasone 50 MCG/ACT nasal spray  Commonly known as:  FLONASE  Place 1 spray into both nostrils at bedtime.     ibuprofen 400 MG tablet  Commonly known as:  MOTRIN IB  Take 1-1.5 tablets (400-600 mg total) by mouth 4 (four) times daily -  with meals and at bedtime.     lisinopril-hydrochlorothiazide 20-12.5 MG tablet  Commonly known as:  PRINZIDE,ZESTORETIC  Take 1 tablet by mouth daily.     metFORMIN 500 MG tablet  Commonly known as:  GLUCOPHAGE  Take 500 mg by mouth 2 (two) times daily with a meal.     simvastatin 10 MG tablet  Commonly known as:  ZOCOR  Take 10 mg by mouth daily at 6 PM.        Allergies  Allergen Reactions  . Compazine [Prochlorperazine Edisylate] Rash    Muscle contractions  . Sulfa Antibiotics Rash  . Sulfazine [Sulfasalazine] Rash     Procedures/Studies: Myoview  Dg Chest 2 View  01/14/2016  CLINICAL DATA:  Left-sided chest pain and shortness of breath EXAM: CHEST  2 VIEW COMPARISON:  None. FINDINGS: Mild opacity in left lung base is somewhat platelike suggesting atelectasis. No pneumothorax. No  pulmonary nodules or masses. No other infiltrates. The heart, hila, and mediastinum are unremarkable. IMPRESSION: Mild opacity in the left lung base. Atelectasis is considered more likely than developing infiltrate. Recommend follow-up as clinically warranted. Electronically Signed   By: Gerome Samavid  Williams III M.D   On: 01/14/2016 22:44   Nm Myocar Multi W/spect W/wall Motion / Ef  01/15/2016  CLINICAL DATA:  Chest pain, hypertension, hyperlipidemia and chronic kidney disease. EXAM: MYOCARDIAL IMAGING WITH SPECT (REST AND PHARMACOLOGIC-STRESS) GATED LEFT VENTRICULAR WALL MOTION STUDY LEFT VENTRICULAR EJECTION FRACTION TECHNIQUE: Standard myocardial SPECT imaging was performed after resting intravenous injection of 10 mCi Tc-5351m tetrofosmin. Subsequently, intravenous infusion of Lexiscan was performed under the supervision of the Cardiology staff. At peak effect of the drug, 30 mCi Tc-6651m tetrofosmin was injected intravenously and standard myocardial SPECT imaging was performed. Quantitative gated imaging was also performed to evaluate left ventricular wall motion, and estimate left ventricular ejection fraction. COMPARISON:  None. FINDINGS: Perfusion: No evidence of inducible myocardial ischemia. Mild attenuation of the inferior wall on both stress and rest acquisitions is felt to most likely represent diaphragmatic attenuation. Wall Motion: Normal left ventricular wall motion. No left ventricular dilation. Left Ventricular Ejection Fraction: 67 % End diastolic volume 101 ml End systolic volume 34 ml IMPRESSION: 1. No evidence of myocardial ischemia. Mild inferior wall attenuation is felt to represent diaphragmatic attenuation. 2. Normal left ventricular wall motion. 3. Left ventricular ejection fraction 67% 4. Non invasive risk stratification*: Low *2012 Appropriate Use Criteria for Coronary Revascularization Focused Update: J Am Coll Cardiol. 2012;59(9):857-881.  http://content.dementiazones.comonlinejacc.org/article.aspx?articleid=1201161 Electronically Signed   By: Irish LackGlenn  Yamagata M.D.   On: 01/15/2016 13:46       Discharge Exam: Filed Vitals:   01/15/16 1142 01/15/16 1354  BP: 139/69 117/54  Pulse: 70 59  Temp:  98.2 F (36.8 C)  Resp: 17 16   Filed Vitals:   01/15/16 1029 01/15/16 1141 01/15/16 1142 01/15/16 1354  BP:  139/69 139/69 117/54  Pulse: 72  70 59  Temp:    98.2 F (36.8 C)  TempSrc:    Oral  Resp:   17 16  Height:      Weight:      SpO2:   96% 94%    General: Pt is alert, awake, not in acute distress Cardiovascular: RRR, S1/S2 +, no rubs, no gallops Respiratory: CTA bilaterally, no wheezing, no rhonchi Abdominal: Soft, NT, ND, bowel sounds + Extremities: no edema, no cyanosis    The results of significant diagnostics from this hospitalization (including imaging, microbiology, ancillary and laboratory) are listed below for reference.     Microbiology: No results found for this or any previous visit (from the past  240 hour(s)).   Labs: BNP (last 3 results)  Recent Labs  01/15/16 0001  BNP 17.7   Basic Metabolic Panel:  Recent Labs Lab 01/14/16 2153  NA 136  K 3.9  CL 101  CO2 23  GLUCOSE 83  BUN 35*  CREATININE 1.36*  CALCIUM 9.4   Liver Function Tests: No results for input(s): AST, ALT, ALKPHOS, BILITOT, PROT, ALBUMIN in the last 168 hours. No results for input(s): LIPASE, AMYLASE in the last 168 hours. No results for input(s): AMMONIA in the last 168 hours. CBC:  Recent Labs Lab 01/14/16 2153  WBC 8.9  NEUTROABS 5.7  HGB 14.8  HCT 43.4  MCV 87.3  PLT 184   Cardiac Enzymes:  Recent Labs Lab 01/14/16 2153 01/15/16 0017 01/15/16 0545 01/15/16 1150  TROPONINI <0.03 <0.03 <0.03 <0.03   BNP: Invalid input(s): POCBNP CBG:  Recent Labs Lab 01/15/16 0717 01/15/16 1147  GLUCAP 100* 144*   D-Dimer No results for input(s): DDIMER in the last 72 hours. Hgb A1c No results for input(s):  HGBA1C in the last 72 hours. Lipid Profile No results for input(s): CHOL, HDL, LDLCALC, TRIG, CHOLHDL, LDLDIRECT in the last 72 hours. Thyroid function studies No results for input(s): TSH, T4TOTAL, T3FREE, THYROIDAB in the last 72 hours.  Invalid input(s): FREET3 Anemia work up No results for input(s): VITAMINB12, FOLATE, FERRITIN, TIBC, IRON, RETICCTPCT in the last 72 hours. Urinalysis    Component Value Date/Time   COLORURINE YELLOW 12/25/2009 2134   APPEARANCEUR CLOUDY* 12/25/2009 2134   LABSPEC 1.012 12/25/2009 2134   PHURINE 6.0 12/25/2009 2134   GLUCOSEU NEGATIVE 12/25/2009 2134   HGBUR NEGATIVE 12/25/2009 2134   BILIRUBINUR NEGATIVE 12/25/2009 2134   KETONESUR NEGATIVE 12/25/2009 2134   PROTEINUR NEGATIVE 12/25/2009 2134   UROBILINOGEN 0.2 12/25/2009 2134   NITRITE NEGATIVE 12/25/2009 2134   LEUKOCYTESUR  12/25/2009 2134    NEGATIVE MICROSCOPIC NOT DONE ON URINES WITH NEGATIVE PROTEIN, BLOOD, LEUKOCYTES, NITRITE, OR GLUCOSE <1000 mg/dL.   Sepsis Labs Invalid input(s): PROCALCITONIN,  WBC,  LACTICIDVEN Microbiology No results found for this or any previous visit (from the past 240 hour(s)).   Time coordinating discharge: Over 30 minutes  SIGNED:   Calvert CantorIZWAN,Gar Glance, MD  [facility] 01/19/2016, 5:06 PM Pager   If 7PM-7AM, please contact night-coverage [email or web address] Password [email or web address]

## 2016-10-16 DIAGNOSIS — J310 Chronic rhinitis: Secondary | ICD-10-CM | POA: Insufficient documentation

## 2016-10-16 DIAGNOSIS — R0683 Snoring: Secondary | ICD-10-CM | POA: Insufficient documentation

## 2016-10-16 DIAGNOSIS — J343 Hypertrophy of nasal turbinates: Secondary | ICD-10-CM | POA: Insufficient documentation

## 2016-10-30 ENCOUNTER — Other Ambulatory Visit: Payer: Self-pay | Admitting: Otolaryngology

## 2016-10-31 NOTE — Pre-Procedure Instructions (Signed)
Kevin Jones  10/31/2016      [facility] [address], [place] - [address] [place] [address] Phone: [phone] Fax: [phone]    Your procedure is scheduled on Fri. Apr. 20 at 0730 AM  Report to [facility] Admitting at 530 AM.  Call this number if you have problems the morning of surgery:  [phone]   Remember:  Do not eat food or drink liquids after midnight.  Take these medicines the morning of surgery with A SIP OF WATER amlodipine (norvasc), atenolol (tenormin), fluticasone (flonase), loratidine (claritin), oxymetazoline (afrin).  Take all other medications as prescribed except 7 days prior to surgery STOP taking any Aspirin, Aleve, Naproxen, Ibuprofen, Motrin, Advil, Goody's, BC's, all herbal medications, fish oil, and all vitamins   Do not wear jewelry, make-up or nail polish.  Do not wear lotions, powders, or perfumes, or deoderant.             Men may shave face and neck.  Do not bring valuables to the hospital.  [facility] is not responsible for any belongings or valuables.  Contacts, dentures or bridgework may not be worn into surgery.  Leave your suitcase in the car.  After surgery it may be brought to your room.  For patients admitted to the hospital, discharge time will be determined by your treatment team.  Patients discharged the day of surgery will not be allowed to drive home.      How to Manage Your Diabetes Before and After Surgery  Why is it important to control my blood sugar before and after surgery? . Improving blood sugar levels before and after surgery helps healing and can limit problems. . A way of improving blood sugar control is eating a healthy diet by: o  Eating less sugar and carbohydrates o  Increasing activity/exercise o  Talking with your doctor about reaching your blood sugar goals . High blood sugars (greater than 180 mg/dL) can raise your risk of infections and  slow your recovery, so you will need to focus on controlling your diabetes during the weeks before surgery. . Make sure that the doctor who takes care of your diabetes knows about your planned surgery including the date and location.  How do I manage my blood sugar before surgery? . Check your blood sugar at least 4 times a day, starting 2 days before surgery, to make sure that the level is not too high or low. o Check your blood sugar the morning of your surgery when you wake up and every 2 hours until you get to the Short Stay unit. . If your blood sugar is less than 70 mg/dL, you will need to treat for low blood sugar: o Do not take insulin. o Treat a low blood sugar (less than 70 mg/dL) with  cup of clear juice (cranberry or apple), 4 glucose tablets, OR glucose gel. o Recheck blood sugar in 15 minutes after treatment (to make sure it is greater than 70 mg/dL). If your blood sugar is not greater than 70 mg/dL on recheck, call [phone] for further instructions. . Report your blood sugar to the short stay nurse when you get to Short Stay.  . If you are admitted to the hospital after surgery: o Your blood sugar will be checked by the staff and you will probably be given insulin after surgery (instead of oral diabetes medicines) to make sure you have good blood sugar levels. o The goal  for blood sugar control after surgery is 80-180 mg/dL.      WHAT DO I DO ABOUT MY DIABETES MEDICATION?   Marland Kitchen Do not take oral diabetes medicines (pills) the morning of surgery.    Reviewed and Endorsed by [facility] Patient Education Committee, August 2015   Special instructions:   [facility]- Preparing For Surgery  Before surgery, you can play an important role. Because skin is not sterile, your skin needs to be as free of germs as possible. You can reduce the number of germs on your skin by washing with CHG (chlorahexidine gluconate) Soap before surgery.  CHG is an antiseptic cleaner which kills  germs and bonds with the skin to continue killing germs even after washing.  Please do not use if you have an allergy to CHG or antibacterial soaps. If your skin becomes reddened/irritated stop using the CHG.  Do not shave (including legs and underarms) for at least 48 hours prior to first CHG shower. It is OK to shave your face.  Please follow these instructions carefully.   1. Shower the NIGHT BEFORE SURGERY and the MORNING OF SURGERY with CHG.   2. If you chose to wash your hair, wash your hair first as usual with your normal shampoo.  3. After you shampoo, rinse your hair and body thoroughly to remove the shampoo.  4. Use CHG as you would any other liquid soap. You can apply CHG directly to the skin and wash gently with a scrungie or a clean washcloth.   5. Apply the CHG Soap to your body ONLY FROM THE NECK DOWN.  Do not use on open wounds or open sores. Avoid contact with your eyes, ears, mouth and genitals (private parts). Wash genitals (private parts) with your normal soap.  6. Wash thoroughly, paying special attention to the area where your surgery will be performed.  7. Thoroughly rinse your body with warm water from the neck down.  8. DO NOT shower/wash with your normal soap after using and rinsing off the CHG Soap.  9. Pat yourself dry with a CLEAN TOWEL.   10. Wear CLEAN PAJAMAS   11. Place CLEAN SHEETS on your bed the night of your first shower and DO NOT SLEEP WITH PETS.    Day of Surgery: Do not apply any deodorants/lotions. Please wear clean clothes to the hospital/surgery center.     Please read over the following fact sheets that you were given. Pain Booklet, Coughing and Deep Breathing and Surgical Site Infection Prevention

## 2016-11-01 ENCOUNTER — Encounter (HOSPITAL_COMMUNITY)
Admission: RE | Admit: 2016-11-01 | Discharge: 2016-11-01 | Disposition: A | Discharge status: Home / Self Care | Payer: Medicare Other | Source: Ambulatory Visit | Attending: Otolaryngology | Admitting: Otolaryngology

## 2016-11-01 ENCOUNTER — Encounter (HOSPITAL_COMMUNITY): Payer: Self-pay

## 2016-11-01 DIAGNOSIS — J342 Deviated nasal septum: Secondary | ICD-10-CM | POA: Diagnosis not present

## 2016-11-01 DIAGNOSIS — J343 Hypertrophy of nasal turbinates: Secondary | ICD-10-CM | POA: Diagnosis not present

## 2016-11-01 HISTORY — DX: Headache, unspecified: R51.9

## 2016-11-01 HISTORY — DX: Headache: R51

## 2016-11-01 HISTORY — DX: Unspecified osteoarthritis, unspecified site: M19.90

## 2016-11-01 LAB — CBC
HCT: 47.4 % (ref 39.0–52.0)
Hemoglobin: 16.5 g/dL (ref 13.0–17.0)
MCH: 31.1 pg (ref 26.0–34.0)
MCHC: 34.8 g/dL (ref 30.0–36.0)
MCV: 89.4 fL (ref 78.0–100.0)
Platelets: 173 10*3/uL (ref 150–400)
RBC: 5.3 MIL/uL (ref 4.22–5.81)
RDW: 14.2 % (ref 11.5–15.5)
WBC: 9.1 10*3/uL (ref 4.0–10.5)

## 2016-11-01 LAB — BASIC METABOLIC PANEL
Anion gap: 11 (ref 5–15)
BUN: 19 mg/dL (ref 6–20)
CALCIUM: 9.6 mg/dL (ref 8.9–10.3)
CHLORIDE: 103 mmol/L (ref 101–111)
CO2: 25 mmol/L (ref 22–32)
CREATININE: 1.02 mg/dL (ref 0.61–1.24)
GFR calc Af Amer: >60 mL/min (ref >60)
GFR calc non Af Amer: >60 mL/min (ref >60)
GLUCOSE: 106 mg/dL — AB (ref 65–99)
Potassium: 4.1 mmol/L (ref 3.5–5.1)
Sodium: 139 mmol/L (ref 135–145)

## 2016-11-01 LAB — HEMOGLOBIN A1C
HEMOGLOBIN A1C: 5.3 % (ref 4.8–5.6)
MEAN PLASMA GLUCOSE: 105 mg/dL

## 2016-11-01 LAB — GLUCOSE, CAPILLARY: Glucose-Capillary: 104 mg/dL — ABNORMAL HIGH (ref 65–99)

## 2016-11-01 NOTE — Progress Notes (Signed)
   11/01/16 0823  OBSTRUCTIVE SLEEP APNEA  Have you ever been diagnosed with sleep apnea through a sleep study? No  Do you snore loudly (loud enough to be heard through closed doors)?  0  Do you often feel tired, fatigued, or sleepy during the daytime (such as falling asleep during driving or talking to someone)? 0  Has anyone observed you stop breathing during your sleep? 0  Do you have, or are you being treated for high blood pressure? 1  BMI more than 35 kg/m2? 1  Age > 50 (1-yes) 1  Neck circumference greater than:Male 16 inches or larger, Male 17inches or larger? 1  Male Gender (Yes=1) 1  Obstructive Sleep Apnea Score 5  Score 5 or greater  Results sent to PCP

## 2016-11-01 NOTE — Progress Notes (Signed)
PCP: Dr. Lanier Ensign @ [facility]  No cardiologist  [facility] hospital staff cardiologist 12/2015 when he came to ED. Had stress test  And only to follow up with PCP  Doesn't check blood sugars--doesn't have a machine.

## 2016-11-05 ENCOUNTER — Encounter (HOSPITAL_COMMUNITY): Payer: Self-pay

## 2016-11-05 NOTE — Progress Notes (Signed)
Anesthesia Chart Review:  Pt is a 71 year old male scheduled for nasal septoplasty with bilateral turbinate reduction on 11/09/2016 with Osborn Coho, M.D.  PCP is Lanier Ensign, MD, last office visit 10/09/16.  PMH includes: HTN, DM, hyperlipidemia. Never smoker. BMI 40.  Medications include: Amlodipine, ASA 81 mg, atenolol, lisinopril-HCTZ, metformin, simvastatin.  Preoperative labs reviewed. HbA1c 5.3, glucose 106.  CXR 01/14/16:Mild opacity in the left lung base. Atelectasis is considered more likely than developing infiltrate. Recommend follow-up as clinically warranted.  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   Nuclear stress test 01/15/16: 1. No evidence of myocardial ischemia. Mild inferior wall attenuation is felt to represent diaphragmatic attenuation. 2. Normal left ventricular wall motion. 3. Left ventricular ejection fraction 67% 4. Non invasive risk stratification: Low  If no changes, I anticipate pt can proceed with surgery as scheduled.   Rica Mast, FNP-BC [facility] Short Stay Surgical Center/Anesthesiology Phone: [phone] 11/05/2016 3:28 PM

## 2016-11-08 MED ORDER — DEXTROSE 5 % IV SOLN
3.0000 g | INTRAVENOUS | Status: AC
  Administered 2016-11-09: 3 g via INTRAVENOUS
  Filled 2016-11-08: qty 3000

## 2016-11-09 ENCOUNTER — Ambulatory Visit (HOSPITAL_COMMUNITY): Payer: Medicare Other | Admitting: Anesthesiology

## 2016-11-09 ENCOUNTER — Encounter (HOSPITAL_COMMUNITY)
Admission: RE | Disposition: A | Discharge status: Home / Self Care | Payer: Self-pay | Source: Ambulatory Visit | Attending: Otolaryngology

## 2016-11-09 ENCOUNTER — Ambulatory Visit (HOSPITAL_COMMUNITY): Payer: Medicare Other | Admitting: Emergency Medicine

## 2016-11-09 ENCOUNTER — Encounter (HOSPITAL_COMMUNITY): Payer: Self-pay | Admitting: *Deleted

## 2016-11-09 ENCOUNTER — Ambulatory Visit (HOSPITAL_COMMUNITY)
Admission: RE | Admit: 2016-11-09 | Discharge: 2016-11-09 | Disposition: A | Discharge status: Home / Self Care | Payer: Medicare Other | Source: Ambulatory Visit | Attending: Otolaryngology | Admitting: Otolaryngology

## 2016-11-09 DIAGNOSIS — I10 Essential (primary) hypertension: Secondary | ICD-10-CM | POA: Diagnosis not present

## 2016-11-09 DIAGNOSIS — Z79899 Other long term (current) drug therapy: Secondary | ICD-10-CM | POA: Insufficient documentation

## 2016-11-09 DIAGNOSIS — Z7982 Long term (current) use of aspirin: Secondary | ICD-10-CM | POA: Diagnosis not present

## 2016-11-09 DIAGNOSIS — J343 Hypertrophy of nasal turbinates: Secondary | ICD-10-CM | POA: Diagnosis not present

## 2016-11-09 DIAGNOSIS — Z7951 Long term (current) use of inhaled steroids: Secondary | ICD-10-CM | POA: Diagnosis not present

## 2016-11-09 DIAGNOSIS — J3489 Other specified disorders of nose and nasal sinuses: Secondary | ICD-10-CM | POA: Diagnosis present

## 2016-11-09 DIAGNOSIS — E785 Hyperlipidemia, unspecified: Secondary | ICD-10-CM | POA: Diagnosis not present

## 2016-11-09 DIAGNOSIS — Z7984 Long term (current) use of oral hypoglycemic drugs: Secondary | ICD-10-CM | POA: Insufficient documentation

## 2016-11-09 DIAGNOSIS — J342 Deviated nasal septum: Secondary | ICD-10-CM | POA: Diagnosis not present

## 2016-11-09 DIAGNOSIS — Z6839 Body mass index (BMI) 39.0-39.9, adult: Secondary | ICD-10-CM | POA: Diagnosis not present

## 2016-11-09 DIAGNOSIS — E119 Type 2 diabetes mellitus without complications: Secondary | ICD-10-CM | POA: Diagnosis not present

## 2016-11-09 HISTORY — PX: NASAL SEPTOPLASTY W/ TURBINOPLASTY: SHX2070

## 2016-11-09 LAB — GLUCOSE, CAPILLARY
GLUCOSE-CAPILLARY: 110 mg/dL — AB (ref 65–99)
GLUCOSE-CAPILLARY: 136 mg/dL — AB (ref 65–99)

## 2016-11-09 SURGERY — SEPTOPLASTY, NOSE, WITH NASAL TURBINATE REDUCTION
Anesthesia: General | Site: Nose | Laterality: Bilateral

## 2016-11-09 MED ORDER — MIDAZOLAM HCL 5 MG/5ML IJ SOLN
INTRAMUSCULAR | Status: DC | PRN
  Administered 2016-11-09: 2 mg via INTRAVENOUS

## 2016-11-09 MED ORDER — SUGAMMADEX SODIUM 500 MG/5ML IV SOLN
INTRAVENOUS | Status: AC
  Filled 2016-11-09: qty 5

## 2016-11-09 MED ORDER — MUPIROCIN 2 % EX OINT
TOPICAL_OINTMENT | CUTANEOUS | Status: AC
  Filled 2016-11-09: qty 22

## 2016-11-09 MED ORDER — MIDAZOLAM HCL 2 MG/2ML IJ SOLN
INTRAMUSCULAR | Status: AC
  Filled 2016-11-09: qty 2

## 2016-11-09 MED ORDER — FENTANYL CITRATE (PF) 250 MCG/5ML IJ SOLN
INTRAMUSCULAR | Status: AC
  Filled 2016-11-09: qty 5

## 2016-11-09 MED ORDER — 0.9 % SODIUM CHLORIDE (POUR BTL) OPTIME
TOPICAL | Status: DC | PRN
  Administered 2016-11-09: 1000 mL

## 2016-11-09 MED ORDER — ONDANSETRON HCL 4 MG/2ML IJ SOLN
INTRAMUSCULAR | Status: AC
  Filled 2016-11-09: qty 2

## 2016-11-09 MED ORDER — OXYMETAZOLINE HCL 0.05 % NA SOLN
NASAL | Status: AC
  Filled 2016-11-09: qty 15

## 2016-11-09 MED ORDER — HYDROCODONE-ACETAMINOPHEN 5-325 MG PO TABS: 1.0000 | ORAL_TABLET | Freq: Four times a day (QID) | ORAL | 0 refills | Status: DC | PRN

## 2016-11-09 MED ORDER — STERILE WATER FOR INJECTION IJ SOLN
INTRAMUSCULAR | Status: DC | PRN
  Administered 2016-11-09: 10000 mL

## 2016-11-09 MED ORDER — CHLORHEXIDINE GLUCONATE CLOTH 2 % EX PADS: 6.0000 | MEDICATED_PAD | Freq: Once | CUTANEOUS | Status: DC

## 2016-11-09 MED ORDER — DEXAMETHASONE SODIUM PHOSPHATE 10 MG/ML IJ SOLN: 10.0000 mg | Freq: Once | INTRAMUSCULAR | Status: DC

## 2016-11-09 MED ORDER — DEXAMETHASONE SODIUM PHOSPHATE 10 MG/ML IJ SOLN
INTRAMUSCULAR | Status: AC
Start: 2016-11-09 — End: 2016-11-09
  Filled 2016-11-09: qty 1

## 2016-11-09 MED ORDER — MUPIROCIN CALCIUM 2 % EX CREA
TOPICAL_CREAM | CUTANEOUS | Status: AC
  Filled 2016-11-09: qty 15

## 2016-11-09 MED ORDER — OXYMETAZOLINE HCL 0.05 % NA SOLN
NASAL | Status: DC | PRN
  Administered 2016-11-09: 1 via TOPICAL

## 2016-11-09 MED ORDER — HYDROMORPHONE HCL 1 MG/ML IJ SOLN: 0.2500 mg | INTRAMUSCULAR | Status: DC | PRN

## 2016-11-09 MED ORDER — FENTANYL CITRATE (PF) 100 MCG/2ML IJ SOLN
INTRAMUSCULAR | Status: DC | PRN
  Administered 2016-11-09: 100 ug via INTRAVENOUS

## 2016-11-09 MED ORDER — ROCURONIUM BROMIDE 100 MG/10ML IV SOLN
INTRAVENOUS | Status: DC | PRN
Start: 2016-11-09 — End: 2016-11-09
  Administered 2016-11-09: 30 mg via INTRAVENOUS

## 2016-11-09 MED ORDER — AMOXICILLIN-POT CLAVULANATE 500-125 MG PO TABS: 1.0000 | ORAL_TABLET | Freq: Two times a day (BID) | ORAL | 0 refills | Status: DC

## 2016-11-09 MED ORDER — PROPOFOL 10 MG/ML IV BOLUS
INTRAVENOUS | Status: AC
  Filled 2016-11-09: qty 20

## 2016-11-09 MED ORDER — SUGAMMADEX SODIUM 500 MG/5ML IV SOLN
INTRAVENOUS | Status: DC | PRN
  Administered 2016-11-09: 250 mg via INTRAVENOUS

## 2016-11-09 MED ORDER — SALINE SPRAY 0.65 % NA SOLN
1.0000 | NASAL | Status: DC | PRN
  Administered 2016-11-09: 1 via NASAL
  Filled 2016-11-09 (×2): qty 44

## 2016-11-09 MED ORDER — MUPIROCIN 2 % EX OINT
TOPICAL_OINTMENT | CUTANEOUS | Status: DC | PRN
  Administered 2016-11-09: 1 via NASAL

## 2016-11-09 MED ORDER — LIDOCAINE-EPINEPHRINE 1 %-1:100000 IJ SOLN
INTRAMUSCULAR | Status: DC | PRN
  Administered 2016-11-09: 20 mL

## 2016-11-09 MED ORDER — LACTATED RINGERS IV SOLN
INTRAVENOUS | Status: DC | PRN
  Administered 2016-11-09: 07:00:00 via INTRAVENOUS

## 2016-11-09 MED ORDER — PROMETHAZINE HCL 25 MG/ML IJ SOLN: 6.2500 mg | INTRAMUSCULAR | Status: DC | PRN

## 2016-11-09 MED ORDER — DEXAMETHASONE SODIUM PHOSPHATE 10 MG/ML IJ SOLN
INTRAMUSCULAR | Status: DC | PRN
  Administered 2016-11-09: 10 mg via INTRAVENOUS

## 2016-11-09 MED ORDER — SUGAMMADEX SODIUM 200 MG/2ML IV SOLN
INTRAVENOUS | Status: AC
  Filled 2016-11-09: qty 2

## 2016-11-09 MED ORDER — LIDOCAINE 2% (20 MG/ML) 5 ML SYRINGE
INTRAMUSCULAR | Status: AC
  Filled 2016-11-09: qty 5

## 2016-11-09 MED ORDER — SUCCINYLCHOLINE CHLORIDE 20 MG/ML IJ SOLN
INTRAMUSCULAR | Status: DC | PRN
  Administered 2016-11-09: 120 mg via INTRAVENOUS

## 2016-11-09 MED ORDER — ONDANSETRON HCL 4 MG/2ML IJ SOLN
INTRAMUSCULAR | Status: DC | PRN
  Administered 2016-11-09: 4 mg via INTRAVENOUS

## 2016-11-09 MED ORDER — LIDOCAINE HCL (CARDIAC) 20 MG/ML IV SOLN
INTRAVENOUS | Status: DC | PRN
  Administered 2016-11-09: 80 mg via INTRAVENOUS

## 2016-11-09 MED ORDER — LIDOCAINE-EPINEPHRINE 1 %-1:100000 IJ SOLN
INTRAMUSCULAR | Status: AC
  Filled 2016-11-09: qty 1

## 2016-11-09 MED ORDER — PROPOFOL 10 MG/ML IV BOLUS
INTRAVENOUS | Status: DC | PRN
  Administered 2016-11-09: 200 mg via INTRAVENOUS

## 2016-11-09 SURGICAL SUPPLY — 32 items
BLADE SURG 15 STRL LF DISP TIS (BLADE) ×1 IMPLANT
BLADE SURG 15 STRL SS (BLADE) ×2
CANISTER SUCT 3000ML PPV (MISCELLANEOUS) ×3 IMPLANT
COAGULATOR SUCT 8FR VV (MISCELLANEOUS) IMPLANT
DECANTER SPIKE VIAL GLASS SM (MISCELLANEOUS) ×3 IMPLANT
DRAPE HALF SHEET 40X57 (DRAPES) IMPLANT
ELECT REM PT RETURN 9FT ADLT (ELECTROSURGICAL)
ELECTRODE REM PT RTRN 9FT ADLT (ELECTROSURGICAL) IMPLANT
GAUZE SPONGE 2X2 8PLY STRL LF (GAUZE/BANDAGES/DRESSINGS) ×1 IMPLANT
GLOVE BIOGEL M 7.0 STRL (GLOVE) ×6 IMPLANT
GLOVE BIOGEL PI IND STRL 6.5 (GLOVE) ×1 IMPLANT
GLOVE BIOGEL PI INDICATOR 6.5 (GLOVE) ×2
GLOVE SURG SS PI 6.5 STRL IVOR (GLOVE) ×3 IMPLANT
GOWN STRL REUS W/ TWL LRG LVL3 (GOWN DISPOSABLE) ×2 IMPLANT
GOWN STRL REUS W/ TWL XL LVL3 (GOWN DISPOSABLE) ×1 IMPLANT
GOWN STRL REUS W/TWL LRG LVL3 (GOWN DISPOSABLE) ×4
GOWN STRL REUS W/TWL XL LVL3 (GOWN DISPOSABLE) ×2
KIT BASIN OR (CUSTOM PROCEDURE TRAY) ×3 IMPLANT
KIT ROOM TURNOVER OR (KITS) ×3 IMPLANT
NEEDLE HYPO 25GX1X1/2 BEV (NEEDLE) ×3 IMPLANT
NS IRRIG 1000ML POUR BTL (IV SOLUTION) ×3 IMPLANT
PAD ARMBOARD 7.5X6 YLW CONV (MISCELLANEOUS) ×6 IMPLANT
SPLINT NASAL DOYLE BI-VL (GAUZE/BANDAGES/DRESSINGS) ×3 IMPLANT
SPONGE GAUZE 2X2 STER 10/PKG (GAUZE/BANDAGES/DRESSINGS) ×2
SPONGE NEURO XRAY DETECT 1X3 (DISPOSABLE) ×3 IMPLANT
SUT ETHILON 3 0 PS 1 (SUTURE) ×3 IMPLANT
SUT PLAIN 4 0 ~~LOC~~ 1 (SUTURE) ×3 IMPLANT
TOWEL OR 17X24 6PK STRL BLUE (TOWEL DISPOSABLE) ×3 IMPLANT
TRAY ENT MC OR (CUSTOM PROCEDURE TRAY) ×3 IMPLANT
TUBE SALEM SUMP 16 FR W/ARV (TUBING) ×3 IMPLANT
TUBING EXTENTION W/L.L. (IV SETS) ×3 IMPLANT
WATER STERILE IRR 1000ML POUR (IV SOLUTION) ×3 IMPLANT

## 2016-11-09 NOTE — Anesthesia Preprocedure Evaluation (Addendum)
Anesthesia Evaluation  Patient identified by MRN, date of birth, ID band Patient awake    Reviewed: Allergy & Precautions, NPO status , Patient's Chart, lab work & pertinent test results  Airway Mallampati: II   Neck ROM: Full  Mouth opening: Limited Mouth Opening  Dental no notable dental hx. (+) Teeth Intact   Pulmonary neg pulmonary ROS,    breath sounds clear to auscultation       Cardiovascular hypertension,  Rhythm:Regular Rate:Normal     Neuro/Psych    GI/Hepatic   Endo/Other  diabetesMorbid obesity  Renal/GU      Musculoskeletal  (+) Arthritis ,   Abdominal (+) + obese,   Peds  Hematology   Anesthesia Other Findings   Reproductive/Obstetrics                            Anesthesia Physical Anesthesia Plan  ASA: III  Anesthesia Plan: General   Post-op Pain Management:    Induction: Intravenous  Airway Management Planned: Oral ETT and Video Laryngoscope Planned  Additional Equipment:   Intra-op Plan:   Post-operative Plan: Extubation in OR  Informed Consent: I have reviewed the patients History and Physical, chart, labs and discussed the procedure including the risks, benefits and alternatives for the proposed anesthesia with the patient or authorized representative who has indicated his/her understanding and acceptance.     Plan Discussed with: CRNA  Anesthesia Plan Comments:        Anesthesia Quick Evaluation

## 2016-11-09 NOTE — Anesthesia Postprocedure Evaluation (Addendum)
Anesthesia Post Note  Patient: Kevin Jones  Procedure(s) Performed: Procedure(s) (LRB): NASAL SEPTOPLASTY WITH BILATERAL TURBINATE REDUCTION (Bilateral)  Patient location during evaluation: PACU Anesthesia Type: General Level of consciousness: awake and alert Pain management: pain level controlled Vital Signs Assessment: post-procedure vital signs reviewed and stable Respiratory status: spontaneous breathing, nonlabored ventilation, respiratory function stable and patient connected to nasal cannula oxygen Cardiovascular status: blood pressure returned to baseline and stable Postop Assessment: no signs of nausea or vomiting Anesthetic complications: no       Last Vitals:  Vitals:   11/09/16 0930 11/09/16 0940  BP: 129/76 (!) 145/83  Pulse: 65 67  Resp: 10 12  Temp: 36.5 C     Last Pain:  Vitals:   11/09/16 0610  TempSrc: Oral                 Atiba Kimberlin,JAMES TERRILL

## 2016-11-09 NOTE — H&P (Signed)
Kevin Jones is an 71 y.o. male.   Chief Complaint: Nasal obstruction HPI: Hx of progressive Nasal obstruction  Past Medical History:  Diagnosis Date  . Arthritis   . Diabetes mellitus without complication (HCC)   . Headache    from sinus congestion  . Hyperlipidemia   . Hypertension     Past Surgical History:  Procedure Laterality Date  . KNEE ARTHROSCOPY Left within last 5 yrs.  Marland Kitchen thumb surgery, left  Left     Family History  Problem Relation Age of Onset  . Cirrhosis Sister   . Lung cancer Sister    Social History:  reports that he has never smoked. He has never used smokeless tobacco. He reports that he drinks alcohol. He reports that he does not use drugs.  Allergies:  Allergies  Allergen Reactions  . Compazine [Prochlorperazine Edisylate] Rash    Muscle contractions  . Sulfa Antibiotics Rash  . Sulfazine [Sulfasalazine] Rash    Medications Prior to Admission  Medication Sig Dispense Refill  . allopurinol (ZYLOPRIM) 300 MG tablet Take 450 mg by mouth at bedtime.     Marland Kitchen amLODipine (NORVASC) 10 MG tablet Take 10 mg by mouth every evening.     Marland Kitchen atenolol (TENORMIN) 50 MG tablet Take 50 mg by mouth daily.    . fluticasone (FLONASE) 50 MCG/ACT nasal spray Place 1 spray into both nostrils at bedtime.     Marland Kitchen lisinopril-hydrochlorothiazide (PRINZIDE,ZESTORETIC) 20-25 MG tablet Take 1 tablet by mouth every evening.    . loratadine (CLARITIN) 10 MG tablet Take 10 mg by mouth daily.    . metFORMIN (GLUCOPHAGE) 500 MG tablet Take 500 mg by mouth 2 (two) times daily with a meal.    . oxymetazoline (AFRIN) 0.05 % nasal spray Place 1 spray into both nostrils 2 (two) times daily as needed for congestion.    . simvastatin (ZOCOR) 10 MG tablet Take 10 mg by mouth daily at 6 PM.     . aspirin EC 81 MG tablet Take 81 mg by mouth daily.      Results for orders placed or performed during the hospital encounter of 11/09/16 (from the past 48 hour(s))  Glucose, capillary     Status:  Abnormal   Collection Time: 11/09/16  6:14 AM  Result Value Ref Range   Glucose-Capillary 110 (H) 65 - 99 mg/dL   Comment 1 Notify RN    Comment 2 Document in Chart    No results found.  Review of Systems  Constitutional: Negative.   HENT: Negative.   Respiratory: Negative.   Cardiovascular: Negative.     Blood pressure (!) 145/70, pulse 60, temperature 98 F (36.7 C), temperature source Oral, resp. rate 20, weight 122 kg (269 lb), SpO2 96 %. Physical Exam  Constitutional: He appears well-developed and well-nourished.  HENT:  Nasal obstruction  Neck: Normal range of motion. Neck supple.  Cardiovascular: Normal rate.   Respiratory: Effort normal.  GI: Soft.     Assessment/Plan Adm for OP septo and IT reduction  Markham Dumlao, MD 11/09/2016, 7:26 AM

## 2016-11-09 NOTE — Op Note (Signed)
NAMEHANNAN, HUTMACHER NO.:  [id number]  MEDICAL RECORD NO.:  [id number]  LOCATION:  [facility]                         FACILITY:  [facility]  PHYSICIAN:  Kinnie Scales. Annalee Genta, M.D.DATE OF BIRTH:  [birth year]  DATE OF PROCEDURE:  11/09/2016 DATE OF DISCHARGE:                              OPERATIVE REPORT   LOCATION:  [facility] Main OR.  PREOPERATIVE DIAGNOSES: 1. Nasal septal deviation with airway obstruction. 2. Bilateral inferior turbinate hypertrophy.  POSTOPERATIVE DIAGNOSES: 1. Nasal septal deviation with airway obstruction. 2. Bilateral inferior turbinate hypertrophy.  INDICATION FOR SURGERY: 1. Nasal septal deviation with airway obstruction. 2. Bilateral inferior turbinate hypertrophy.  PROCEDURE: 1. Nasal septoplasty. 2. Bilateral inferior turbinate reduction.  SURGEON:  Kinnie Scales. Annalee Genta, MD.  ANESTHESIA:  General endotracheal.  The patient transferred from the operating room to the recovery room in stable condition.  No complications.  ESTIMATED BLOOD LOSS:  Less than 100 mL.  BRIEF HISTORY:  The patient is a 72 year old male, who was referred to our office for evaluation of progressive symptoms of nasal airway obstruction and history of previous nasal trauma and increasing difficulty with nasal breathing, particularly at night and when exercising.  Given the patient's history and physical findings, which showed a severely deviated septum with right septal spurring and obstruction.  I recommended nasal septoplasty and inferior turbinate reduction.  The risks and benefits of procedure were discussed in detail with the patient and his wife and they understood and agree with our plan for surgery, which is scheduled on elective basis at [facility] Main OR.  DESCRIPTION OF PROCEDURE:  The patient was brought to the operating room on November 09, 2016, and placed supine position on the operating table. General endotracheal anesthesia was  established without difficulty. When the patient was adequately anesthetized, he was positioned on the operating table and prepped and draped.  Surgical time-out was then performed with correct identification of the patient and the surgical procedure.  He was then injected with a total of 7 mL of 1% lidocaine with 1:100,000 dilution epinephrine, which was injected in a submucosal fashion along the nasal septum and inferior turbinates bilaterally.  The patient's nose was then packed with Afrin-soaked cottonoid pledgets were left in place for approximately 10 minutes to allow for vasoconstriction and hemostasis.  When the patient prepped, draped, and prepared for surgery, a nasal septoplasty was begun by creating a left anterior hemitransfixion incision, which was carried through the mucosa and underlying submucosa. Mucoperichondrial flap was elevated from anterior to posterior, anterior cartilaginous septum was crossed and a mucoperichondrial flap was elevated on the right.  The anterior and mid septal cartilage were removed.  This was later morselized and returned to the mucoperichondrial pocket for reconstruction.  Dissection was then carried from anterior to posterior dissecting and removing deviated bone and cartilage.  The patient had a large bony septal spur.  This was mobilized using a 4-mm osteotome preserving the overlying mucosa.  With the septum brought to a good midline position, the preserved cartilage was morselized and returned to the mucoperichondrial pocket.  The mucosal flaps were reapproximated with a 4-0 gut suture on a Keith needle  in a horizontal mattressing fashion and the anterior hemitransfixion incision was closed with the same stitch.  At the conclusion of the procedure, bilateral Doyle nasal septal splints were placed after the application of Bactroban ointment and sutured into position with a 3-0 Ethilon suture.  Inferior turbinate reduction was then  performed.  Cautery set at 12 watts.  Two submucosal passes were made in each inferior turbinate.  The turbinates were adequately cauterized, anterior incisions were created, overlying soft tissue was elevated.  A small amount of turbinate bone was resected.  The turbinates were then outfractured creating more patent nasal passageway.  The patient's nasal cavity was irrigated and suctioned.  There was no active bleeding.  An orogastric tube was passed.  Stomach contents were aspirated.  The patient was then awakened from his anesthetic.  He was extubated and transferred from the operating room to the recovery room in stable condition.  There were no complications and estimated blood loss was less 100 mL.          ______________________________ Kinnie Scales. Annalee Genta, M.D.     DLS/MEDQ  D:  16/04/9603  T:  11/09/2016  Job:  [id number]

## 2016-11-09 NOTE — Brief Op Note (Signed)
11/09/2016  8:44 AM  PATIENT:  Kevin Jones  71 y.o. male   PRE-OPERATIVE DIAGNOSIS:  DEVIATED SEPTUM, BILATERAL NASAL TURBINATE HYPERTROPHY  POST-OPERATIVE DIAGNOSIS:  DEVIATED SEPTUM, BILATERAL NASAL TURBINATE HYPERTROPHY  PROCEDURE:  Procedure(s): NASAL SEPTOPLASTY WITH BILATERAL TURBINATE REDUCTION (Bilateral)  SURGEON:  Surgeon(s) and Role:    * Osborn Coho, MD - Primary  PHYSICIAN ASSISTANT:   ASSISTANTS: none   ANESTHESIA:   general  EBL:  No intake/output data recorded.  BLOOD ADMINISTERED:none  DRAINS: none   LOCAL MEDICATIONS USED:  LIDOCAINE  and Amount: 7 ml  SPECIMEN:  No Specimen  DISPOSITION OF SPECIMEN:  N/A  COUNTS:  YES  TOURNIQUET:  * No tourniquets in log *  DICTATION: .Other Dictation: Dictation Number [id number]  PLAN OF CARE: Discharge to home after PACU  PATIENT DISPOSITION:  PACU - hemodynamically stable.   Delay start of Pharmacological VTE agent (>24hrs) due to surgical blood loss or risk of bleeding: not applicable

## 2016-11-09 NOTE — Transfer of Care (Signed)
Immediate Anesthesia Transfer of Care Note  Patient: Kevin Jones  Procedure(s) Performed: Procedure(s): NASAL SEPTOPLASTY WITH BILATERAL TURBINATE REDUCTION (Bilateral)  Patient Location: PACU  Anesthesia Type:General  Level of Consciousness: awake  Airway & Oxygen Therapy: Patient Spontanous Breathing  Post-op Assessment: Report given to RN and Post -op Vital signs reviewed and stable  Post vital signs: Reviewed and stable  Last Vitals:  Vitals:   11/09/16 0610 11/09/16 0851  BP: (!) 145/70 (!) 146/88  Pulse: 60 71  Resp: 20 18  Temp: 36.7 C     Last Pain:  Vitals:   11/09/16 0610  TempSrc: Oral         Complications: No apparent anesthesia complications

## 2016-11-09 NOTE — Anesthesia Procedure Notes (Signed)
Procedure Name: Intubation Date/Time: 11/09/2016 7:53 AM Performed by: Gavin Pound, Daylene Vandenbosch J Pre-anesthesia Checklist: Patient identified, Emergency Drugs available, Suction available, Patient being monitored and Timeout performed Patient Re-evaluated:Patient Re-evaluated prior to inductionOxygen Delivery Method: Circle system utilized Preoxygenation: Pre-oxygenation with 100% oxygen Intubation Type: IV induction Ventilation: Mask ventilation without difficulty Laryngoscope Size: Glidescope Tube type: Oral Tube size: 7.5 mm Number of attempts: 1 Placement Confirmation: positive ETCO2 and breath sounds checked- equal and bilateral Secured at: 21 cm Tube secured with: Tape Dental Injury: Teeth and Oropharynx as per pre-operative assessment

## 2016-11-10 ENCOUNTER — Encounter (HOSPITAL_COMMUNITY): Payer: Self-pay | Admitting: Otolaryngology

## 2016-12-21 NOTE — Addendum Note (Signed)
Addendum  created 12/21/16 1409 by Feliciana Narayan, MD   Sign clinical note    

## 2017-04-30 DIAGNOSIS — H6983 Other specified disorders of Eustachian tube, bilateral: Secondary | ICD-10-CM | POA: Insufficient documentation

## 2017-04-30 DIAGNOSIS — H8110 Benign paroxysmal vertigo, unspecified ear: Secondary | ICD-10-CM | POA: Insufficient documentation

## 2017-08-06 ENCOUNTER — Ambulatory Visit (INDEPENDENT_AMBULATORY_CARE_PROVIDER_SITE_OTHER): Payer: Medicare Other

## 2017-08-06 ENCOUNTER — Ambulatory Visit: Payer: Medicare Other | Admitting: Podiatry

## 2017-08-06 DIAGNOSIS — M722 Plantar fascial fibromatosis: Secondary | ICD-10-CM

## 2017-08-06 MED ORDER — MELOXICAM 15 MG PO TABS: 15.0000 mg | ORAL_TABLET | Freq: Every day | ORAL | 3 refills | Status: DC

## 2017-08-06 MED ORDER — OXYCODONE-ACETAMINOPHEN 10-325 MG PO TABS: 1.0000 | ORAL_TABLET | ORAL | 0 refills | Status: DC | PRN

## 2017-08-06 NOTE — Patient Instructions (Signed)

## 2017-08-06 NOTE — Progress Notes (Signed)
   Subjective:    Patient ID: Kevin Jones, male    DOB: [birth year], 72 y.o.   MRN: [id number]  HPI: He presents today chief complaint of pain to his right heel.  He states that he very well may be a rheumatological flareup but he is not sure.  States that he started his prednisone and it has not helped yet.  Continues to wear his night splint which is not helping.    Review of Systems  All other systems reviewed and are negative.      Objective:   Physical Exam: Vital signs are stable he is alert and oriented x3.  Pulses are strongly palpable.  Neurologic sensorium is intact.  Deep tendon reflexes are intact.  Muscle strength was 5/5 dorsiflexors plantar flexors inverters and everters all intrinsic musculature is intact.  The pubic evaluation demonstrates all joints distal to the ankle full range of motion without crepitation.  He has pain on palpation medial calcaneal tubercle of the right heel.  Radiographs taken in the office today demonstrate no acute findings though it does demonstrate a soft tissue increase in density at the plantar fascial calcaneal insertion site.  No open lesions or wounds are noted.        Assessment & Plan:  Plantar fasciitis right foot.  Cannot rule out enthesopathy associated with rheumatological disease.  Plan: Discussed etiology pathology conservative versus surgical therapies.  Added meloxicam to his regimen of medications.  Placed a 20 mg Kenalog and 5 mg Marcaine injection after sterile Betadine skin prep to the medial aspect of the right heel were point of maximal tenderness is.  Also recommended appropriate shoe gear.  I will follow-up with him in 1 month.

## 2017-08-29 ENCOUNTER — Ambulatory Visit: Payer: Medicare Other | Admitting: Podiatry

## 2017-08-29 ENCOUNTER — Encounter: Payer: Self-pay | Admitting: Podiatry

## 2017-08-29 DIAGNOSIS — M722 Plantar fascial fibromatosis: Secondary | ICD-10-CM

## 2017-08-29 MED ORDER — OXYCODONE-ACETAMINOPHEN 10-325 MG PO TABS: 1.0000 | ORAL_TABLET | ORAL | 0 refills | Status: DC | PRN

## 2017-09-01 NOTE — Progress Notes (Signed)
He presents today for follow-up of his right heel.  He states that is better but is still some pain sometimes.  Objective: Pulses are palpable.  Neurologic sensorium is intact he still has pain on palpation medial calcaneal tubercle of the right heel.  Assessment: Residual plantar fasciitis about 50% resolved.  Plan: Continue all current therapies including oral medication plantar fascial brace and night splint.  I reinjected him today after Betadine skin prep and verbal consent.  20 mg of Kenalog 5 mg Marcaine to the point of maximal tenderness of the right heel.  Tolerated procedure well without comp occasions.

## 2017-10-15 ENCOUNTER — Ambulatory Visit: Payer: Medicare Other | Admitting: Podiatry

## 2018-06-03 ENCOUNTER — Encounter: Payer: Self-pay | Admitting: Podiatry

## 2018-06-03 ENCOUNTER — Encounter

## 2018-06-03 ENCOUNTER — Ambulatory Visit (INDEPENDENT_AMBULATORY_CARE_PROVIDER_SITE_OTHER): Payer: Medicare Other | Admitting: Podiatry

## 2018-06-03 DIAGNOSIS — M722 Plantar fascial fibromatosis: Secondary | ICD-10-CM

## 2018-06-04 NOTE — Progress Notes (Signed)
He presents today for follow-up of pain to the posterior inferior aspect of his right heel.  He states that the plantar fasciitis has resolved when I drive this area right heel here still hurts he states that it bothers me while I am sitting with my foot on the gas pedal.  As he points to the posterior inferior aspect of his heel.  Objective: Vital signs are stable he is alert and oriented x3.  Pulses are palpable.  No change in neurological status.  The distal aspect of his Achilles as it turns around the plantar aspect of the heel toward the fascia is painful right on the corner he has no pain on palpation medial calcaneal tubercle.  There are no fissures in the skin.  Assessment: Distal insertional Achilles tendinitis or very proximal plantar fasciitis.  Plan: At this point I injected 10 mg of Kenalog 5 mg Marcaine point of maximal tenderness through a 30-gauge needle right at the angular change of the posterior heel.  He tolerated procedure well without complications we will follow-up with me in 1 month if not improved.

## 2019-05-13 ENCOUNTER — Other Ambulatory Visit: Payer: Self-pay | Admitting: Physical Medicine and Rehabilitation

## 2019-05-13 DIAGNOSIS — R29818 Other symptoms and signs involving the nervous system: Secondary | ICD-10-CM

## 2019-05-13 DIAGNOSIS — G9519 Other vascular myelopathies: Secondary | ICD-10-CM

## 2019-05-13 DIAGNOSIS — M48062 Spinal stenosis, lumbar region with neurogenic claudication: Secondary | ICD-10-CM

## 2019-05-18 ENCOUNTER — Other Ambulatory Visit: Payer: Self-pay

## 2019-05-18 ENCOUNTER — Ambulatory Visit
Admission: RE | Admit: 2019-05-18 | Discharge: 2019-05-18 | Disposition: A | Discharge status: Home / Self Care | Payer: Medicare Other | Source: Ambulatory Visit | Attending: Physical Medicine and Rehabilitation | Admitting: Physical Medicine and Rehabilitation

## 2019-05-18 DIAGNOSIS — M48062 Spinal stenosis, lumbar region with neurogenic claudication: Secondary | ICD-10-CM

## 2019-05-18 DIAGNOSIS — G9519 Other vascular myelopathies: Secondary | ICD-10-CM

## 2019-07-07 ENCOUNTER — Other Ambulatory Visit: Payer: Self-pay | Admitting: Physical Medicine and Rehabilitation

## 2019-07-07 DIAGNOSIS — M48062 Spinal stenosis, lumbar region with neurogenic claudication: Secondary | ICD-10-CM

## 2019-07-07 DIAGNOSIS — G9519 Other vascular myelopathies: Secondary | ICD-10-CM

## 2019-07-30 ENCOUNTER — Ambulatory Visit
Admission: RE | Admit: 2019-07-30 | Discharge: 2019-07-30 | Disposition: A | Discharge status: Home / Self Care | Payer: Medicare Other | Source: Ambulatory Visit | Attending: Physical Medicine and Rehabilitation | Admitting: Physical Medicine and Rehabilitation

## 2019-07-30 ENCOUNTER — Other Ambulatory Visit: Payer: Self-pay

## 2019-07-30 DIAGNOSIS — M48062 Spinal stenosis, lumbar region with neurogenic claudication: Secondary | ICD-10-CM

## 2019-07-30 DIAGNOSIS — G9519 Other vascular myelopathies: Secondary | ICD-10-CM

## 2019-08-13 ENCOUNTER — Other Ambulatory Visit: Payer: Self-pay | Admitting: Physical Medicine and Rehabilitation

## 2019-08-13 ENCOUNTER — Telehealth: Payer: Self-pay | Admitting: Nurse Practitioner

## 2019-08-13 DIAGNOSIS — M48062 Spinal stenosis, lumbar region with neurogenic claudication: Secondary | ICD-10-CM

## 2019-08-13 DIAGNOSIS — G9519 Other vascular myelopathies: Secondary | ICD-10-CM

## 2019-08-13 NOTE — Telephone Encounter (Signed)
Phone call to patient to verify medication list and allergies for myelogram procedure. Pt aware he will not need to hold any medications for this procedure. Pt verbalized understanding. Pre and post procedure instructions reviewed with pt. 

## 2019-08-20 ENCOUNTER — Other Ambulatory Visit: Payer: PRIVATE HEALTH INSURANCE

## 2020-01-15 IMAGING — MR MR LUMBAR SPINE W/O CM
4 of 5 series · 25 of 48 positions shown · non-contrast
Comparison: None.

CLINICAL DATA: Low back pain bilateral leg pain

EXAM:
MRI LUMBAR SPINE WITHOUT CONTRAST
TECHNIQUE: Multiplanar, multisequence MR imaging of the lumbar spine was
performed. No intravenous contrast was administered.

[Series 3: T2 · sagittal · 4.0mm · 0.55mm/px · 6 of 16 slices shown (1 of 2)]
[im 1/16]
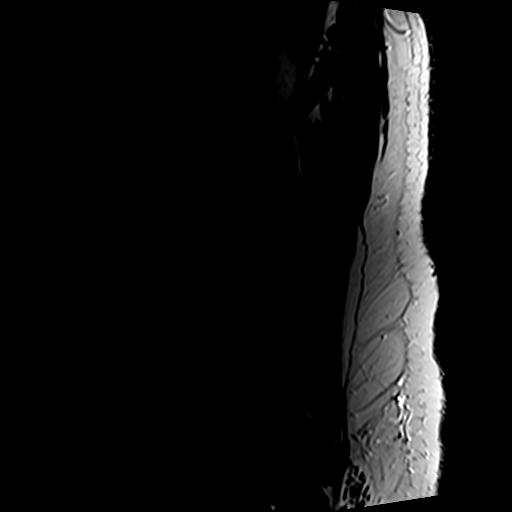
[im 4/16]
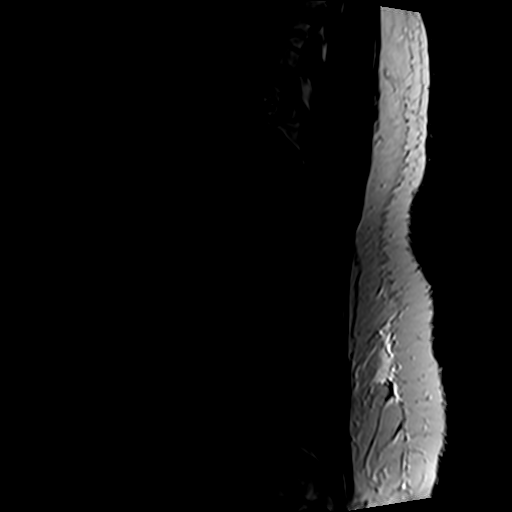
[im 7/16]
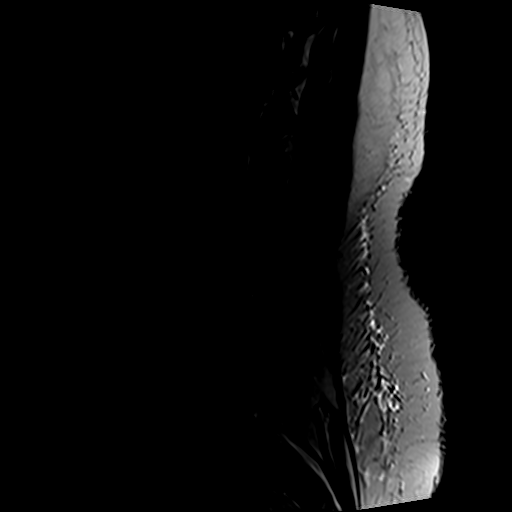
[im 10/16]
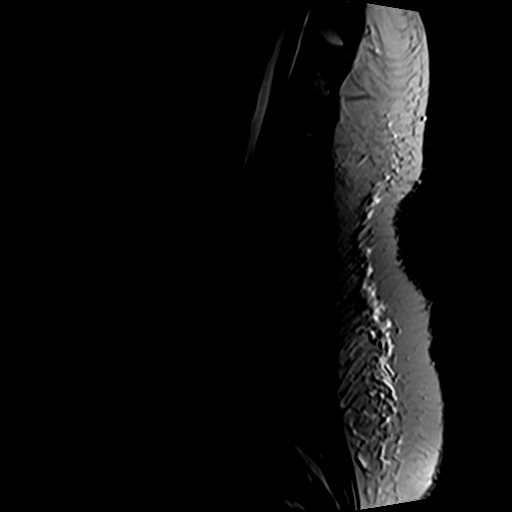
[im 13/16]
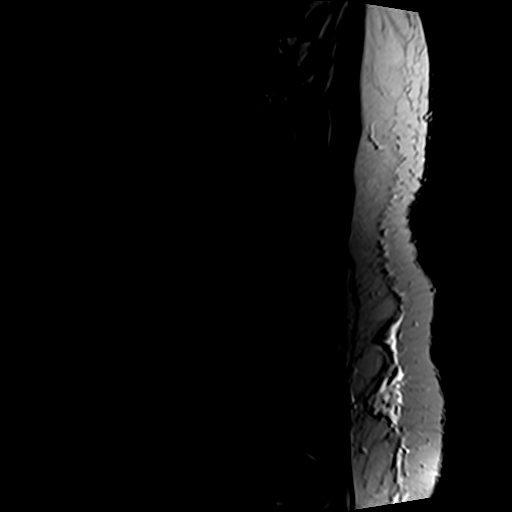
[im 16/16]
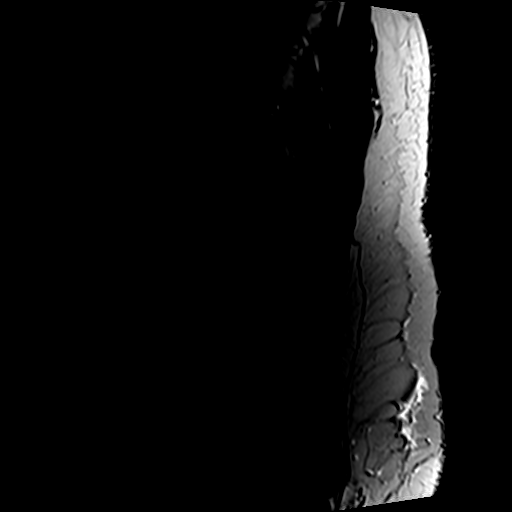

[Series 5: T1 · sagittal · 4.0mm · 0.55mm/px · 6 of 16 slices shown (1 of 2)]
[im 1/16]
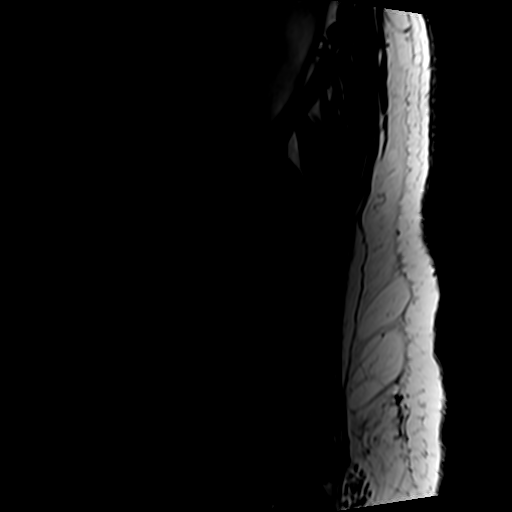
[im 4/16]
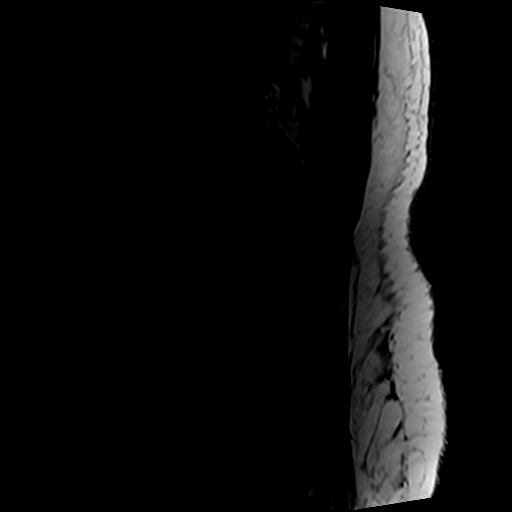
[im 7/16]
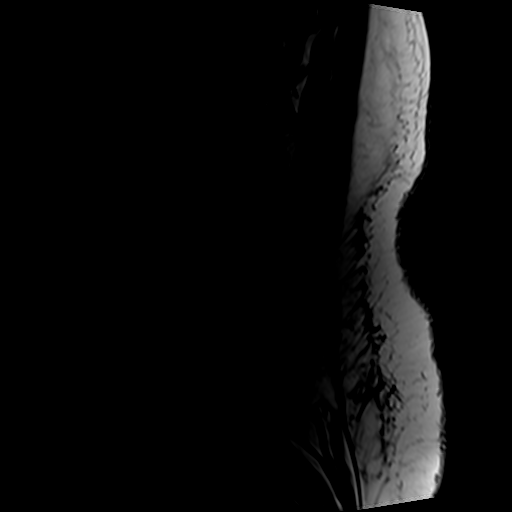
[im 10/16]
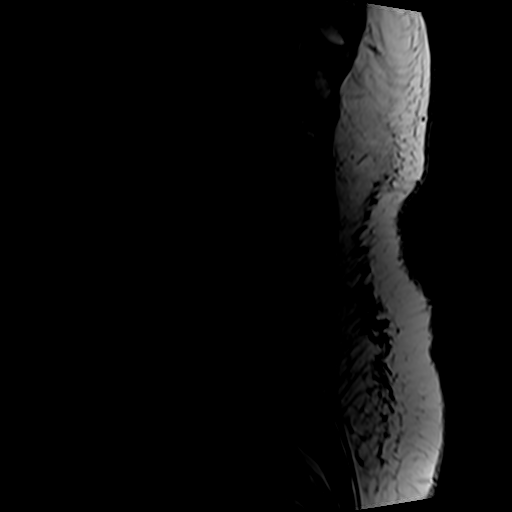
[im 13/16]
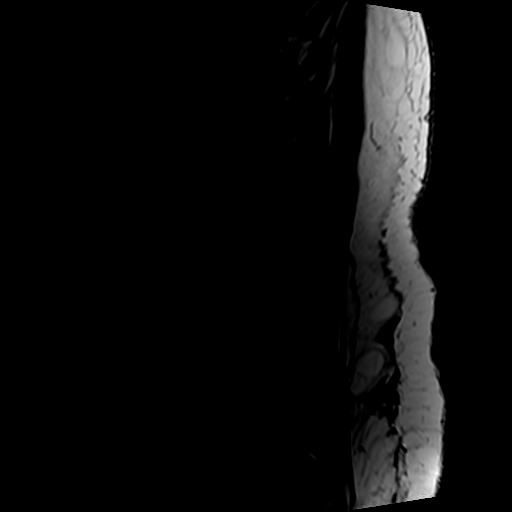
[im 16/16]
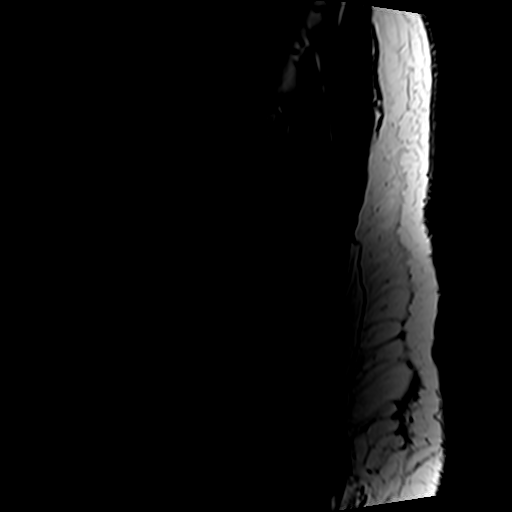

[Series 6: T2 · axial · 4.0mm · 0.70mm/px · z∈[-195,+12]mm · 9 of 37 slices shown (2 of 2)]
[im 1/37]
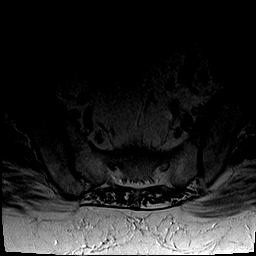
[im 6/37]
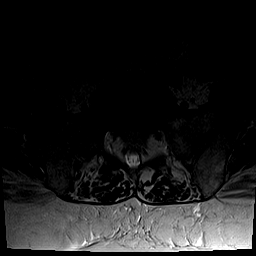
[im 11/37]
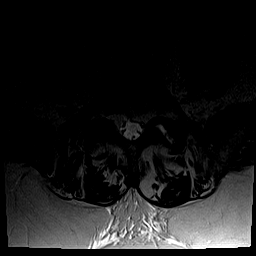
[im 16/37]
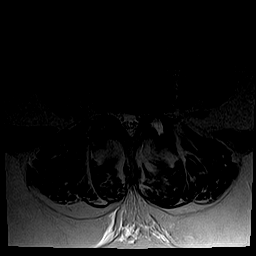
[im 19/37]
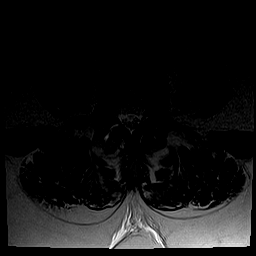
[im 21/37]
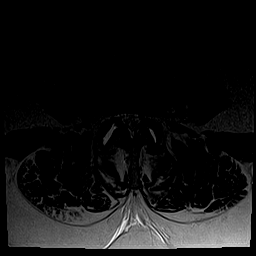
[im 26/37]
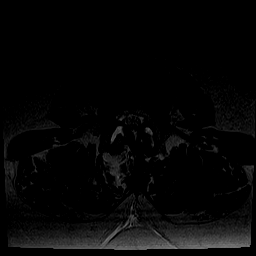
[im 31/37]
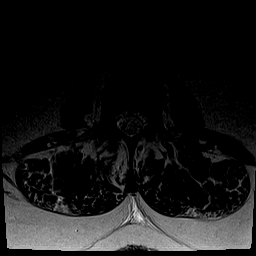
[im 37/37]
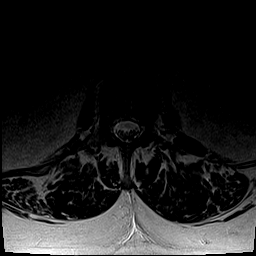

[Series 7: T1 · axial · 4.0mm · 0.35mm/px · z∈[-195,-19]mm · 4 of 37 slices shown (2 of 2)]
[im 1/37]
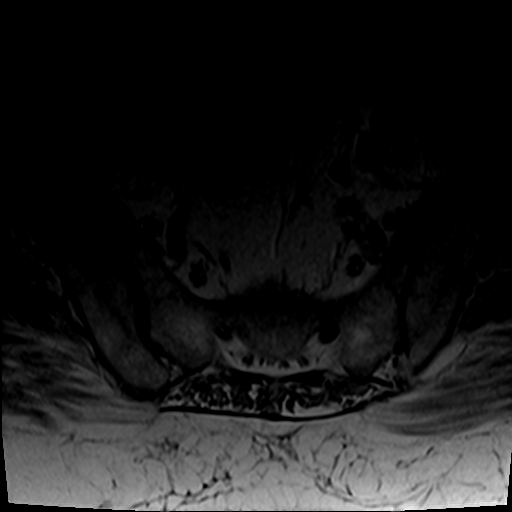
[im 6/37]
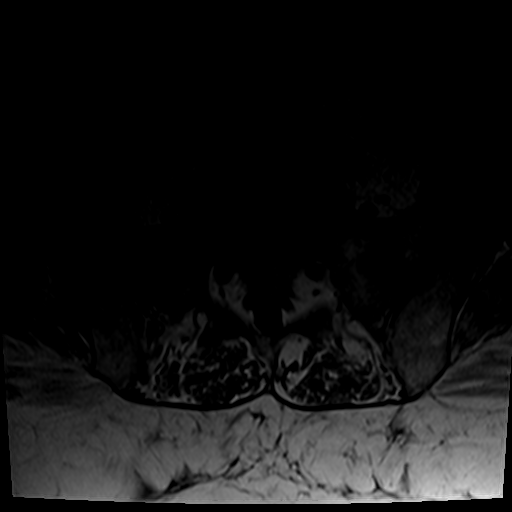
[im 19/37]
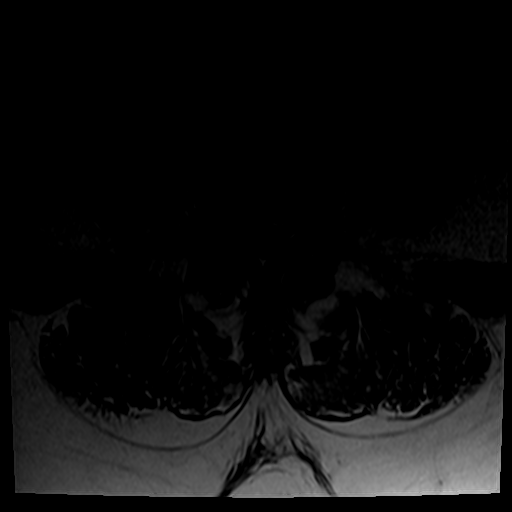
[im 31/37]
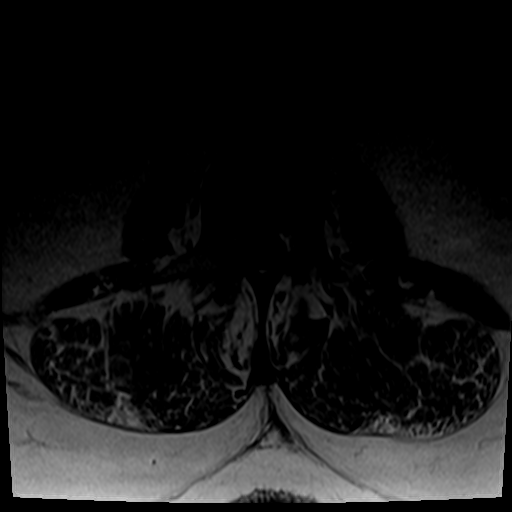

[25 of 48 positions shown; findings below may reference images not displayed]

FINDINGS: Segmentation:  Normal

Alignment:  Slight retrolisthesis L2-3.  Remaining alignment normal.

Vertebrae:  Normal bone marrow.  Negative for fracture or mass.

Conus medullaris and cauda equina: Conus extends to the T12-L1
level. Conus and cauda equina appear normal.

Paraspinal and other soft tissues: Negative for paraspinous mass or
adenopathy.

Disc levels:

L1-2: Mild facet degeneration. Negative for disc protrusion or
stenosis

L2-3: Mild disc degeneration. Shallow extraforaminal disc protrusion
on the left. Advanced facet degeneration with bilateral facet joint
effusions. Mild spinal stenosis. Mild to moderate subarticular
stenosis on the left.

L3-4: Mild disc degeneration. Advanced facet degeneration with facet
joint overgrowth and bilateral facet joint effusions. Intraosseous
synovial cyst in the left lamina of L3. Mild to moderate spinal
stenosis. Mild to moderate subarticular stenosis bilaterally.

L4-5: Shallow extraforaminal disc protrusion on the left. Advanced
facet degeneration with facet hypertrophy and bilateral facet joint
effusions. 3 x 6 mm synovial cyst on the right projecting into the
ligamentum flavum. Moderate to severe spinal stenosis. Moderate
subarticular and foraminal stenosis on the left. Mild stenosis on
the right

L5-S1: Bilateral facet degeneration with mild subarticular stenosis
bilaterally.
IMPRESSION: Multilevel degenerative changes in the lumbar spine. Degenerative
changes most severe in the facet joints at L2-3, L3-4, and L4-5 with
marked facet hypertrophy and facet joint effusions. Right-sided
synovial cyst at L4-5 contributing to spinal stenosis which is
moderate to severe.

Shallow extraforaminal disc protrusion left L2-3 and L4-5.

## 2020-09-09 ENCOUNTER — Other Ambulatory Visit: Payer: Self-pay

## 2020-09-09 ENCOUNTER — Encounter: Payer: Self-pay | Admitting: Pulmonary Disease

## 2020-09-09 ENCOUNTER — Ambulatory Visit (INDEPENDENT_AMBULATORY_CARE_PROVIDER_SITE_OTHER): Payer: Medicare Other | Admitting: Pulmonary Disease

## 2020-09-09 VITALS — BP 122/74 | HR 78 | Temp 98.0°F | Ht 69.0 in | Wt 260.6 lb

## 2020-09-09 DIAGNOSIS — R059 Cough, unspecified: Secondary | ICD-10-CM | POA: Diagnosis not present

## 2020-09-09 DIAGNOSIS — J310 Chronic rhinitis: Secondary | ICD-10-CM

## 2020-09-09 MED ORDER — FLUTICASONE PROPIONATE 50 MCG/ACT NA SUSP: 1.0000 | Freq: Every day | NASAL | 2 refills | Status: DC

## 2020-09-09 MED ORDER — LORATADINE 10 MG PO TABS: 10.0000 mg | ORAL_TABLET | Freq: Every day | ORAL | 11 refills | Status: DC

## 2020-09-09 MED ORDER — HYDROCODONE-HOMATROPINE 5-1.5 MG/5ML PO SYRP: 5.0000 mL | ORAL_SOLUTION | Freq: Every day | ORAL | 0 refills | Status: DC | PRN

## 2020-09-09 NOTE — Progress Notes (Signed)
@Patient  ID: , male    DOB: 10/31/1945, 75 y.o.   MRN: [id number]  Chief Complaint  Patient presents with  . Consult    Referred by PCP for dry cough for the past year. Has been taking hydrocodone cough syrup for the past year without any relief. Denies any SOB. Does have some dry mouth concerns but this is coming from his medications.     Referring provider: [id number], MD  HPI:   75 year old whom we are seeing at the request of PCP for evaluation of chronic cough.  PCP notes reviewed.  Patient had onset of cough about 1 year ago.  It is largely nonproductive.  Occasionally brings up white mucus but this is relatively rare.  Seems worse in the morning after waking up.  Has severe coughing fits at times that led to blown blood vessels in the eye, posttussive emesis.  He has been treated by his primary care doctor.  His PPI was changed to to a different formulation and Imodium was added at night.  He thinks the severity and frequency of cough is about 90% better.  However he still has a daily with severe episodes.  No associated dyspnea on exertion.  He uses Nettie pot for the first time a very long time last night and cough is better this morning.  He does endorse postnasal drip or sensation of mucus dripping the back of his throat.  No clear rhinitis anteriorly.  In the past had worse seasonal allergy symptoms but seems better.  Had been getting about yearly sinus infections but was put on loratadine and has not had any in 2 years.  He was told to stop loratadine about 1 year ago.  He had deviated septum surgery about 10 years ago.  Prior to that he was using Nettie pot for seasonal allergies.  He has not used it since then at the direction of his ENT but again resumed recently as above.  Chest x-ray 10/2019 in the setting of symptoms reviewed and interpreted as clear lungs, no infiltrate, no effusion, no pneumothorax.  PMH: Diabetes hypertension, hyperlipidemia Surgical history:  Sinus surgery 2010 Family history: No respiratory illnesses or issues in first-degree relatives overview Social history: Never smoker, lives with wife, has children  ACT:  No flowsheet data found.  MMRC: No flowsheet data found.  Epworth:  No flowsheet data found.  Tests:   FENO:  No results found for: NITRICOXIDE  PFT: No flowsheet data found.  WALK:  No flowsheet data found.  Imaging: Personally reviewed interpreted as above  Lab Results:  Personally reviewed, notably eosinophils 500 in 2017 CBC    Component Value Date/Time   WBC 9.1 11/01/2016 0845   RBC 5.30 11/01/2016 0845   HGB 16.5 11/01/2016 0845   HCT 47.4 11/01/2016 0845   PLT 173 11/01/2016 0845   MCV 89.4 11/01/2016 0845   MCH 31.1 11/01/2016 0845   MCHC 34.8 11/01/2016 0845   RDW 14.2 11/01/2016 0845   LYMPHSABS 1.9 01/14/2016 2153   MONOABS 0.9 01/14/2016 2153   EOSABS 0.5 01/14/2016 2153   BASOSABS 0.0 01/14/2016 2153    BMET    Component Value Date/Time   NA 139 11/01/2016 0845   K 4.1 11/01/2016 0845   CL 103 11/01/2016 0845   CO2 25 11/01/2016 0845   GLUCOSE 106 (H) 11/01/2016 0845   BUN 19 11/01/2016 0845   CREATININE 1.02 11/01/2016 0845   CALCIUM 9.6 11/01/2016 0845   GFRNONAA >60 11/01/2016  0845   GFRAA >60 11/01/2016 0845    BNP    Component Value Date/Time   BNP 17.7 01/15/2016 0001    ProBNP No results found for: PROBNP  Specialty Problems      Pulmonary Problems   Nasal turbinate hypertrophy   Rhinitis, chronic   Snoring   Deviated septum      Allergies  Allergen Reactions  . Compazine [Prochlorperazine Edisylate] Rash and Other (See Comments)    Muscle contractions  . Sulfa Antibiotics Rash     There is no immunization history on file for this patient.  Past Medical History:  Diagnosis Date  . Arthritis   . Diabetes mellitus without complication (HCC)   . Headache    from sinus congestion  . Hyperlipidemia   . Hypertension     Tobacco  History: Social History   Tobacco Use  Smoking Status Never Smoker  Smokeless Tobacco Never Used   Counseling given: Not Answered   Continue to not smoke  Outpatient Encounter Medications as of 09/09/2020  Medication Sig  . alfuzosin (UROXATRAL) 10 MG 24 hr tablet Take 10 mg by mouth daily.  Marland Kitchen allopurinol (ZYLOPRIM) 300 MG tablet Take 450 mg by mouth at bedtime.   Marland Kitchen amLODipine (NORVASC) 10 MG tablet Take 10 mg by mouth every evening.   Marland Kitchen aspirin EC 81 MG tablet Take 81 mg by mouth daily.  Marland Kitchen atenolol (TENORMIN) 50 MG tablet Take 50 mg by mouth daily.  Marland Kitchen atorvastatin (LIPITOR) 10 MG tablet   . famotidine (PEPCID) 10 MG tablet Take 10 mg by mouth 2 (two) times daily.  . fluticasone (FLONASE) 50 MCG/ACT nasal spray Place 1 spray into both nostrils daily.  Marland Kitchen HYDROcodone-homatropine (HYCODAN) 5-1.5 MG/5ML syrup Take 5 mLs by mouth daily as needed for cough.  . loratadine (CLARITIN) 10 MG tablet Take 1 tablet (10 mg total) by mouth daily.  . metFORMIN (GLUCOPHAGE) 500 MG tablet Take 500 mg by mouth 2 (two) times daily with a meal.  . [DISCONTINUED] tamsulosin (FLOMAX) 0.4 MG CAPS capsule   . [DISCONTINUED] HYDROcodone-acetaminophen (NORCO/VICODIN) 5-325 MG tablet Take 1 tablet by mouth every 6 (six) hours as needed.  . [DISCONTINUED] lisinopril (ZESTRIL) 20 MG tablet Take 20 mg by mouth daily.  . [DISCONTINUED] lisinopril-hydrochlorothiazide (PRINZIDE,ZESTORETIC) 20-25 MG tablet Take 1 tablet by mouth every evening.  . [DISCONTINUED] loratadine (CLARITIN) 10 MG tablet Take 10 mg by mouth daily.  . [DISCONTINUED] methocarbamol (ROBAXIN) 750 MG tablet Take 750 mg by mouth every 8 (eight) hours as needed.   No facility-administered encounter medications on file as of 09/09/2020.     Review of Systems  Review of Systems  No chest pain exertion.  No orthopnea or PND.  Comprehensive review systems otherwise negative.  Physical Exam  BP 122/74   Pulse 78   Temp 98 F (36.7 C) (Temporal)    Ht 5\' 9"  (1.753 m)   Wt 260 lb 9.6 oz (118.2 kg)   SpO2 98% Comment: on RA  BMI 38.48 kg/m   Wt Readings from Last 5 Encounters:  09/09/20 260 lb 9.6 oz (118.2 kg)  11/09/16 269 lb (122 kg)  11/01/16 269 lb 4.8 oz (122.2 kg)  01/15/16 259 lb 11.2 oz (117.8 kg)  04/20/15 262 lb 9.6 oz (119.1 kg)    BMI Readings from Last 5 Encounters:  09/09/20 38.48 kg/m  11/09/16 39.72 kg/m  11/01/16 39.77 kg/m  01/15/16 38.35 kg/m  04/20/15 38.22 kg/m     Physical Exam General  obese, in no acute distress Eyes: EOMI, no icterus Neck supple, no JVP Cardiovascular: Regular rhythm, no murmurs Pulmonary: Clear to auscultation bilaterally, no wheeze Abdomen: Nondistended, bowel sounds present MSK: No synovitis, no joint effusion Neuro: Normal gait, no weakness Psych: No mood, full affect    Assessment & Plan:   Chronic cough: Likely multifactorial.  Seems like GERD is a large contributor given vast improvement in the severity frequency of cough with now using pantoprazole in the morning and famotidine at night.  The addition of the famotidine seems to have helped.  Additionally, he describes clear postnasal drip symptoms and mucus collecting above his throat.  He has a longstanding history of allergic sinusitis.  Recent resume that he cannot seem to get better.  Recommend continue Nettie pot use at night, loratadine daily, Flonase in the morning.  Expect cough to improve.  Small amount of hydrocodone syrup given severe coughing fits.  Allergic rhinitis: Seems worse in the past.  Has consistent postnasal drip.  Denies frank runny nose, watery eyes.  Addition of antihistamine and Flonase as above.   Return in about 3 months (around 12/07/2020).   Karren Burly, MD 09/09/2020

## 2020-09-09 NOTE — Patient Instructions (Signed)
Nice to meet you  The cough is largely due to reflux.  Please continue your current medication for this.  Worry about postnasal drip contributing as well.  Take loratadine 1 pill daily.  Use Flonase 1 spray each nostril daily.  Continue Nettie pot use.  Please make sure that when you use Flonase do not flush your nose with anything else after administration like Nettie pot or saline spray.  I provided a small amount of hydrocodone cough syrup.  This is to help get through the next little bit as we improve your cough.  I do not anticipate further refills of this medication.  Return to clinic in 3 months for follow-up with Dr. Judeth Horn.

## 2020-09-15 ENCOUNTER — Telehealth: Payer: Self-pay | Admitting: Pulmonary Disease

## 2020-09-15 NOTE — Telephone Encounter (Signed)
Received a fax from [facility] in [facility] stating that Kevin Jones is on backorder. I called and spoke with pharmacist. She stated that they did have Tussionex on hand if you wanted to send this instead.   Dr. Judeth Horn, please advise. Thanks!

## 2020-09-16 NOTE — Telephone Encounter (Signed)
MH please advise on a different cough med for the pt.  They hycodan is not available at his pharmacy.  Thanks

## 2020-09-19 ENCOUNTER — Telehealth: Payer: Self-pay | Admitting: Pulmonary Disease

## 2020-09-19 MED ORDER — CHLORPHENIRAMINE-CODEINE 2-8 MG/5ML PO LIQD: 5.0000 mL | Freq: Every day | ORAL | 0 refills | Status: DC | PRN

## 2020-09-19 MED ORDER — HYDROCODONE-HOMATROPINE 5-1.5 MG/5ML PO SYRP: 5.0000 mL | ORAL_SOLUTION | Freq: Every day | ORAL | 0 refills | Status: DC | PRN

## 2020-09-19 NOTE — Telephone Encounter (Signed)
Sent different medicine and cancelled prior script thanks

## 2020-09-19 NOTE — Telephone Encounter (Signed)
Chlorpheniramine-codeine is Tussionex - that is how I created the order by putting in Tussionex. The hycodan I ordered last week and was told it was on back order. Do they have the hycodan syrup? I will not call in hycodan pills.

## 2020-09-19 NOTE — Telephone Encounter (Signed)
Tried calling [facility]- they are closed for lunch until 2:00 pm. Will call back.

## 2020-09-19 NOTE — Telephone Encounter (Addendum)
Called pt's pharmacy and spoke with Herbert Seta who stated they do not have chlorpheniramine-codeine at pharmacy as they do not have any Rx that has chlorpheniramine with the codeine.  Per Herbert Seta, they have either tussionex or hycodan avail.  Dr. Judeth Horn, please advise.

## 2020-09-19 NOTE — Telephone Encounter (Signed)
Called and spoke with pharmacy again about Rx that was sent in. Per pharmacy, Tussionex has chlorpheniramine-hydrocodone not chlorpheniramine-codeine.  Asked if they had Hycodan syrup in stock and they said that they do have the Brand Name Hycodan, not the generic in stock as the generic is on back order.

## 2020-09-28 NOTE — Telephone Encounter (Signed)
Dr. Judeth Horn, Please see patient comment.  This is just an [place].  Thank you.

## 2021-01-13 ENCOUNTER — Other Ambulatory Visit: Payer: Self-pay

## 2021-01-13 DIAGNOSIS — R059 Cough, unspecified: Secondary | ICD-10-CM

## 2021-01-13 DIAGNOSIS — J310 Chronic rhinitis: Secondary | ICD-10-CM

## 2021-01-13 MED ORDER — LORATADINE 10 MG PO TABS: 10.0000 mg | ORAL_TABLET | Freq: Every day | ORAL | 2 refills | Status: DC

## 2021-01-16 ENCOUNTER — Other Ambulatory Visit: Payer: Self-pay

## 2021-01-16 DIAGNOSIS — R059 Cough, unspecified: Secondary | ICD-10-CM

## 2021-01-16 DIAGNOSIS — J310 Chronic rhinitis: Secondary | ICD-10-CM

## 2021-01-16 MED ORDER — FLUTICASONE PROPIONATE 50 MCG/ACT NA SUSP: 1.0000 | Freq: Every day | NASAL | 1 refills | Status: DC

## 2021-01-19 ENCOUNTER — Other Ambulatory Visit: Payer: Self-pay

## 2021-10-19 ENCOUNTER — Other Ambulatory Visit: Payer: Self-pay | Admitting: Pulmonary Disease

## 2021-10-19 DIAGNOSIS — R059 Cough, unspecified: Secondary | ICD-10-CM

## 2021-10-19 DIAGNOSIS — J310 Chronic rhinitis: Secondary | ICD-10-CM

## 2021-12-18 ENCOUNTER — Other Ambulatory Visit: Payer: Self-pay | Admitting: Pulmonary Disease

## 2021-12-18 DIAGNOSIS — R059 Cough, unspecified: Secondary | ICD-10-CM

## 2021-12-18 DIAGNOSIS — J310 Chronic rhinitis: Secondary | ICD-10-CM

## 2022-01-17 ENCOUNTER — Other Ambulatory Visit: Payer: Self-pay | Admitting: Pulmonary Disease

## 2022-01-17 DIAGNOSIS — R059 Cough, unspecified: Secondary | ICD-10-CM

## 2022-01-17 DIAGNOSIS — J310 Chronic rhinitis: Secondary | ICD-10-CM

## 2022-01-24 NOTE — H&P (Signed)
Patient's anticipated LOS is less than 2 midnights, meeting these requirements: - Younger than 29 - Lives within 1 hour of care - Has a competent adult at home to recover with post-op recover - NO history of  - Chronic pain requiring opiods  - Diabetes  - Coronary Artery Disease  - Heart failure  - Heart attack  - Stroke  - DVT/VTE  - Cardiac arrhythmia  - Respiratory Failure/COPD  - Renal failure  - Anemia  - Advanced Liver disease     Kevin Jones is an 76 y.o. male.    Chief Complaint: right shoulder pain  HPI: Pt is a 76 y.o. male complaining of right shoudler pain for multiple years. Pain had continually increased since the beginning. X-rays in the clinic show end-stage arthritic changes of the right shoulder. Pt has tried various conservative treatments which have failed to alleviate their symptoms, including injections and therapy. Various options are discussed with the patient. Risks, benefits and expectations were discussed with the patient. Patient understand the risks, benefits and expectations and wishes to proceed with surgery.   PCP:  [name], MD  D/C Plans: Home  PMH: Past Medical History:  Diagnosis Date   Arthritis    Diabetes mellitus without complication (HCC)    Headache    from sinus congestion   Hyperlipidemia    Hypertension     PSH: Past Surgical History:  Procedure Laterality Date   KNEE ARTHROSCOPY Left within last 5 yrs.   NASAL SEPTOPLASTY W/ TURBINOPLASTY Bilateral 11/09/2016   Procedure: NASAL SEPTOPLASTY WITH BILATERAL TURBINATE REDUCTION;  Surgeon: Osborn Coho, MD;  Location: [facility] OR;  Service: ENT;  Laterality: Bilateral;   thumb surgery, left  Left     Social History:  reports that he has never smoked. He has never used smokeless tobacco. He reports current alcohol use. He reports that he does not use drugs. BMI: Estimated body mass index is 38.48 kg/m as calculated from the following:   Height as of 09/09/20: 5\' 9"   (1.753 m).   Weight as of 09/09/20: 118.2 kg.  Lab Results  Component Value Date   ALBUMIN 3.9 12/25/2009   Diabetes: Patient does not have a diagnosis of diabetes.     Smoking Status:   reports that he has never smoked. He has never used smokeless tobacco.    Allergies:  Allergies  Allergen Reactions   Compazine [Prochlorperazine Edisylate] Rash and Other (See Comments)    Muscle contractions   Sulfa Antibiotics Rash    Medications: No current facility-administered medications for this encounter.   Current Outpatient Medications  Medication Sig Dispense Refill   alfuzosin (UROXATRAL) 10 MG 24 hr tablet Take 10 mg by mouth daily.     allopurinol (ZYLOPRIM) 300 MG tablet Take 450 mg by mouth at bedtime.      amLODipine (NORVASC) 10 MG tablet Take 10 mg by mouth every evening.      aspirin EC 81 MG tablet Take 81 mg by mouth daily.     atenolol (TENORMIN) 50 MG tablet Take 50 mg by mouth daily.     atorvastatin (LIPITOR) 10 MG tablet      famotidine (PEPCID) 10 MG tablet Take 10 mg by mouth 2 (two) times daily.     fluticasone (FLONASE) 50 MCG/ACT nasal spray USE 1 SPRAY IN EACH NOSTRIL DAILY 48 g 2   HYDROcodone-homatropine (HYCODAN) 5-1.5 MG/5ML syrup Take 5 mLs by mouth daily as needed for cough. 120 mL 0   loratadine (  CLARITIN) 10 MG tablet TAKE 1 TABLET DAILY (NEED APPOINTMENT FOR FURTHER REFILLS) 90 tablet 3   metFORMIN (GLUCOPHAGE) 500 MG tablet Take 500 mg by mouth 2 (two) times daily with a meal.      No results found for this or any previous visit (from the past 48 hour(s)). No results found.  ROS: Pain with rom of the right upper extremity  Physical Exam: Alert and oriented 76 y.o. male in no acute distress Cranial nerves 2-12 intact Cervical spine: full rom with no tenderness, nv intact distally Chest: active breath sounds bilaterally, no wheeze rhonchi or rales Heart: regular rate and rhythm, no murmur Abd: non tender non distended with active bowel  sounds Hip is stable with rom  Right shoulder painful and weak rom Nv intact distally No rashes or edema   Assessment/Plan Assessment: right shoulder cuff arthropathy  Plan:  Patient will undergo a right reverse total shoulder by Dr. Ranell Patrick at [facility] Risks benefits and expectations were discussed with the patient. Patient understand risks, benefits and expectations and wishes to proceed. Preoperative templating of the joint replacement has been completed, documented, and submitted to the Operating Room personnel in order to optimize intra-operative equipment management.   Alphonsa Overall PA-C, MPAS [facility] is now [employer] [address], [place], [place] [address] Phone: [phone] [email or web address] Facebook  [employer]

## 2022-01-25 NOTE — Progress Notes (Addendum)
COVID Vaccine Completed:  Yes  Date of COVID positive in last 90 days:  N/A  PCP - [name], MD Cardiologist - N/A Pulmonologist - Vilma Meckel, MD (chronic cough) No longer has cough  Chest x-ray - N/A EKG - 02-06-22 Epic Stress Test - greater than 2 years Epic ECHO - N/A Cardiac Cath - N/A Pacemaker/ICD device last checked: Spinal Cord Stimulator: N/A  Bowel Prep - N/A  Sleep Study - N/A CPAP -   Fasting Blood Sugar -  Checks Blood Sugar - does not check   Blood Thinner Instructions: Aspirin Instructions:  ASA 81.  To stop a week before Last Dose:  Activity level:   Can go up a flight of stairs and perform activities of daily living without stopping and without symptoms of chest pain or shortness of breath.  Able to exercise without symptoms  Anesthesia review:  Stop Bang 6  Patient denies shortness of breath, fever, cough and chest pain at PAT appointment  Patient verbalized understanding of instructions that were given to them at the PAT appointment. Patient was also instructed that they will need to review over the PAT instructions again at home before surgery.

## 2022-01-25 NOTE — Patient Instructions (Addendum)
DUE TO COVID-19 ONLY TWO VISITORS  (aged 75 and older)  IS ALLOWED TO COME WITH YOU AND STAY IN THE WAITING ROOM ONLY DURING PRE OP AND PROCEDURE.   **NO VISITORS ARE ALLOWED IN THE SHORT STAY AREA OR RECOVERY ROOM!!**  IF YOU WILL BE ADMITTED INTO THE HOSPITAL YOU ARE ALLOWED ONLY FOUR SUPPORT PEOPLE DURING VISITATION HOURS ONLY (7 AM -8PM)   The support person(s) must pass our screening, gel in and out Visitors GUEST BADGE MUST BE WORN VISIBLY  One adult visitor may remain with you overnight and MUST be in the room by 8 P.M.   You are not required to [employer] Hand Hygiene often Do NOT share personal items Notify your provider if you are in close contact with someone who has COVID or you develop fever 100.4 or greater, new onset of sneezing, cough, sore throat, shortness of breath or body aches.        Your procedure is scheduled on:  02-16-22   [facility]    Report to admitting at 7:20 AM   Call this number if you have problems the morning of surgery [phone]   Do not eat food :After Midnight the night before surgery   After Midnight you may have the following liquids until 6:50 AM DAY OF SURGERY  Clear Liquid Diet Water Black Coffee (sugar ok, NO MILK/CREAM OR CREAMERS)  Tea (sugar ok, NO MILK/CREAM OR CREAMERS) regular and decaf                             Plain Jell-O (NO RED)                                           Fruit ices (not with fruit pulp, NO RED)                                     Popsicles (NO RED)                                                                  Juice: apple, WHITE grape, WHITE cranberry Sports drinks like Gatorade (NO RED) Clear broth(vegetable,chicken,beef)                   The day of surgery:  Drink ONE (1) Pre-Surgery G2 at 6:50 AM the morning of surgery. Drink in one sitting. Do not sip.  This drink was given to you during your hospital  pre-op appointment visit. Nothing else to drink after  completing the Pre-Surgery G2.          If you have questions, please contact your surgeon's office.   FOLLOW  ANY ADDITIONAL PRE OP INSTRUCTIONS YOU RECEIVED FROM YOUR SURGEON'S OFFICE!!!     Oral Hygiene is also important to reduce your risk of infection.  Remember - BRUSH YOUR TEETH THE MORNING OF SURGERY WITH YOUR REGULAR TOOTHPASTE   Do NOT smoke after Midnight  Take these medicines the morning of surgery with A SIP OF WATER:  Alfuzosin, Atenolol, Amlodipine, Claritin, Pantoprazole.  Okay to Flonase nasal spray and Hydrocodone if needed   DO NOT BRING YOUR HOME MEDICATIONS TO THE HOSPITAL. PHARMACY WILL DISPENSE MEDICATIONS LISTED ON YOUR MEDICATION LIST TO YOU DURING YOUR ADMISSION IN THE HOSPITAL!  How to Manage Your Diabetes Before and After Surgery  Why is it important to control my blood sugar before and after surgery? Improving blood sugar levels before and after surgery helps healing and can limit problems. A way of improving blood sugar control is eating a healthy diet by:  Eating less sugar and carbohydrates  Increasing activity/exercise  Talking with your doctor about reaching your blood sugar goals High blood sugars (greater than 180 mg/dL) can raise your risk of infections and slow your recovery, so you will need to focus on controlling your diabetes during the weeks before surgery. Make sure that the doctor who takes care of your diabetes knows about your planned surgery including the date and location.  How do I manage my blood sugar before surgery? Check your blood sugar at least 4 times a day, starting 2 days before surgery, to make sure that the level is not too high or low. Check your blood sugar the morning of your surgery when you wake up and every 2 hours until you get to the Short Stay unit. If your blood sugar is less than 70 mg/dL, you will need to treat for low blood sugar: Do not take insulin. Treat a low blood sugar  (less than 70 mg/dL) with  cup of clear juice (cranberry or apple), 4 glucose tablets, OR glucose gel. Recheck blood sugar in 15 minutes after treatment (to make sure it is greater than 70 mg/dL). If your blood sugar is not greater than 70 mg/dL on recheck, call [phone] for further instructions. Report your blood sugar to the short stay nurse when you get to Short Stay.  If you are admitted to the hospital after surgery: Your blood sugar will be checked by the staff and you will probably be given insulin after surgery (instead of oral diabetes medicines) to make sure you have good blood sugar levels. The goal for blood sugar control after surgery is 80-180 mg/dL.   WHAT DO I DO ABOUT MY DIABETES MEDICATION?  Do not take oral diabetes medicines (pills) the morning of surgery.  THE DAY BEFORE SURGERY:  Take Metformin as prescribed.       THE MORNING OF SURGERY:  Do not take Metformin .  Reviewed and Endorsed by [facility] Patient Education Committee, August 2015                               You may not have any metal on your body including jewelry, and body piercing             Do not wear lotions, powders, cologne, or deodorant              Men may shave face and neck.    Contacts, dentures or bridgework may not be worn into surgery.   Bring small overnight bag day of surgery.  Do not bring valuables to the hospital. [facility] IS NOT RESPONSIBLE   FOR VALUABLES.   Special Instructions: Bring a  copy of your healthcare power of attorney and living will documents the day of surgery if you haven't scanned them before.  Please read over the following fact sheets you were given: IF YOU HAVE QUESTIONS ABOUT YOUR PRE-OP INSTRUCTIONS PLEASE CALL [phone] [facility]- Preparing for Total Shoulder Arthroplasty    Before surgery, you can play an important role. Because skin is not sterile, your skin needs to be as free of germs as possible. You can reduce the number of  germs on your skin by using the following products. Benzoyl Peroxide Gel Reduces the number of germs present on the skin Applied twice a day to shoulder area starting two days before surgery    ==================================================================  Please follow these instructions carefully:  BENZOYL PEROXIDE 5% GEL  Please do not use if you have an allergy to benzoyl peroxide.   If your skin becomes reddened/irritated stop using the benzoyl peroxide.  Starting two days before surgery, apply as follows: Apply benzoyl peroxide in the morning and at night. Apply after taking a shower. If you are not taking a shower clean entire shoulder front, back, and side along with the armpit with a clean wet washcloth.  Place a quarter-sized dollop on your shoulder and rub in thoroughly, making sure to cover the front, back, and side of your shoulder, along with the armpit.   2 days before ____ AM   ____ PM              1 day before ____ AM   ____ PM                         Do this twice a day for two days.  (Last application is the night before surgery, AFTER using the CHG soap as described below).  Do NOT apply benzoyl peroxide gel on the day of surgery.   [facility] - Preparing for Surgery Before surgery, you can play an important role.  Because skin is not sterile, your skin needs to be as free of germs as possible.  You can reduce the number of germs on your skin by washing with CHG (chlorahexidine gluconate) soap before surgery.  CHG is an antiseptic cleaner which kills germs and bonds with the skin to continue killing germs even after washing. Please DO NOT use if you have an allergy to CHG or antibacterial soaps.  If your skin becomes reddened/irritated stop using the CHG and inform your nurse when you arrive at Short Stay. Do not shave (including legs and underarms) for at least 48 hours prior to the first CHG shower.  You may shave your face/neck.  Please follow these  instructions carefully:  1.  Shower with CHG Soap the night before surgery and the  morning of surgery.  2.  If you choose to wash your hair, wash your hair first as usual with your normal  shampoo.  3.  After you shampoo, rinse your hair and body thoroughly to remove the shampoo.                             4.  Use CHG as you would any other liquid soap.  You can apply chg directly to the skin and wash.  Gently with a scrungie or clean washcloth.  5.  Apply the CHG Soap to your body ONLY FROM THE NECK DOWN.   Do   not use on  face/ open                           Wound or open sores. Avoid contact with eyes, ears mouth and   genitals (private parts).                       Wash face,  Genitals (private parts) with your normal soap.             6.  Wash thoroughly, paying special attention to the area where your    surgery  will be performed.  7.  Thoroughly rinse your body with warm water from the neck down.  8.  DO NOT shower/wash with your normal soap after using and rinsing off the CHG Soap.                9.  Pat yourself dry with a clean towel.            10.  Wear clean pajamas.            11.  Place clean sheets on your bed the night of your first shower and do not  sleep with pets. Day of Surgery : Do not apply any lotions/deodorants the morning of surgery.  Please wear clean clothes to the hospital/surgery center.  FAILURE TO FOLLOW THESE INSTRUCTIONS MAY RESULT IN THE CANCELLATION OF YOUR SURGERY  PATIENT SIGNATURE_________________________________  NURSE SIGNATURE__________________________________  ________________________________________________________________________     Kevin Jones  An incentive spirometer is a tool that can help keep your lungs clear and active. This tool measures how well you are filling your lungs with each breath. Taking long deep breaths may help reverse or decrease the chance of developing breathing (pulmonary) problems (especially infection)  following: A long period of time when you are unable to move or be active. BEFORE THE PROCEDURE  If the spirometer includes an indicator to show your best effort, your nurse or respiratory therapist will set it to a desired goal. If possible, sit up straight or lean slightly forward. Try not to slouch. Hold the incentive spirometer in an upright position. INSTRUCTIONS FOR USE  Sit on the edge of your bed if possible, or sit up as far as you can in bed or on a chair. Hold the incentive spirometer in an upright position. Breathe out normally. Place the mouthpiece in your mouth and seal your lips tightly around it. Breathe in slowly and as deeply as possible, raising the piston or the ball toward the top of the column. Hold your breath for 3-5 seconds or for as long as possible. Allow the piston or ball to fall to the bottom of the column. Remove the mouthpiece from your mouth and breathe out normally. Rest for a few seconds and repeat Steps 1 through 7 at least 10 times every 1-2 hours when you are awake. Take your time and take a few normal breaths between deep breaths. The spirometer may include an indicator to show your best effort. Use the indicator as a goal to work toward during each repetition. After each set of 10 deep breaths, practice coughing to be sure your lungs are clear. If you have an incision (the cut made at the time of surgery), support your incision when coughing by placing a pillow or rolled up towels firmly against it. Once you are able to get out of bed, walk around indoors and cough well. You may stop using  the incentive spirometer when instructed by your caregiver.  RISKS AND COMPLICATIONS Take your time so you do not get dizzy or light-headed. If you are in pain, you may need to take or ask for pain medication before doing incentive spirometry. It is harder to take a deep breath if you are having pain. AFTER USE Rest and breathe slowly and easily. It can be helpful to  keep track of a log of your progress. Your caregiver can provide you with a simple table to help with this. If you are using the spirometer at home, follow these instructions: SEEK MEDICAL CARE IF:  You are having difficultly using the spirometer. You have trouble using the spirometer as often as instructed. Your pain medication is not giving enough relief while using the spirometer. You develop fever of 100.5 F (38.1 C) or higher. SEEK IMMEDIATE MEDICAL CARE IF:  You cough up bloody sputum that had not been present before. You develop fever of 102 F (38.9 C) or greater. You develop worsening pain at or near the incision site. MAKE SURE YOU:  Understand these instructions. Will watch your condition. Will get help right away if you are not doing well or get worse. Document Released: 11/19/2006 Document Revised: 10/01/2011 Document Reviewed: 01/20/2007 [facility] Patient Information 2014 [place], [place].   ________________________________________________________________________

## 2022-02-06 ENCOUNTER — Encounter (HOSPITAL_COMMUNITY): Payer: Self-pay

## 2022-02-06 ENCOUNTER — Other Ambulatory Visit: Payer: Self-pay

## 2022-02-06 ENCOUNTER — Encounter (HOSPITAL_COMMUNITY)
Admission: RE | Admit: 2022-02-06 | Discharge: 2022-02-06 | Disposition: A | Discharge status: Home / Self Care | Payer: Medicare Other | Source: Ambulatory Visit | Attending: Orthopedic Surgery | Admitting: Orthopedic Surgery

## 2022-02-06 VITALS — BP 150/71 | HR 57 | Temp 98.4°F | Resp 18 | Ht 69.5 in | Wt 265.8 lb

## 2022-02-06 DIAGNOSIS — E119 Type 2 diabetes mellitus without complications: Secondary | ICD-10-CM | POA: Insufficient documentation

## 2022-02-06 DIAGNOSIS — I2510 Atherosclerotic heart disease of native coronary artery without angina pectoris: Secondary | ICD-10-CM | POA: Diagnosis not present

## 2022-02-06 DIAGNOSIS — Z01818 Encounter for other preprocedural examination: Secondary | ICD-10-CM | POA: Insufficient documentation

## 2022-02-06 HISTORY — DX: Gastro-esophageal reflux disease without esophagitis: K21.9

## 2022-02-06 HISTORY — DX: Family history of other specified conditions: Z84.89

## 2022-02-06 LAB — CBC
HCT: 42.3 % (ref 39.0–52.0)
Hemoglobin: 13.9 g/dL (ref 13.0–17.0)
MCH: 30.2 pg (ref 26.0–34.0)
MCHC: 32.9 g/dL (ref 30.0–36.0)
MCV: 92 fL (ref 80.0–100.0)
Platelets: 171 10*3/uL (ref 150–400)
RBC: 4.6 MIL/uL (ref 4.22–5.81)
RDW: 13.6 % (ref 11.5–15.5)
WBC: 5.3 10*3/uL (ref 4.0–10.5)
nRBC: 0 % (ref 0.0–0.2)

## 2022-02-06 LAB — BASIC METABOLIC PANEL
Anion gap: 7 (ref 5–15)
BUN: 21 mg/dL (ref 8–23)
CO2: 25 mmol/L (ref 22–32)
Calcium: 8.9 mg/dL (ref 8.9–10.3)
Chloride: 109 mmol/L (ref 98–111)
Creatinine, Ser: 0.91 mg/dL (ref 0.61–1.24)
GFR, Estimated: >60 mL/min (ref >60)
Glucose, Bld: 89 mg/dL (ref 70–99)
Potassium: 4.4 mmol/L (ref 3.5–5.1)
Sodium: 141 mmol/L (ref 135–145)

## 2022-02-06 LAB — SURGICAL PCR SCREEN
MRSA, PCR: NEGATIVE
Staphylococcus aureus: NEGATIVE

## 2022-02-06 LAB — HEMOGLOBIN A1C
Hgb A1c MFr Bld: 4.8 % (ref 4.8–5.6)
Mean Plasma Glucose: 91.06 mg/dL

## 2022-02-06 LAB — GLUCOSE, CAPILLARY: Glucose-Capillary: 97 mg/dL (ref 70–99)

## 2022-02-15 NOTE — Anesthesia Preprocedure Evaluation (Addendum)
Anesthesia Evaluation  Patient identified by MRN, date of birth, ID band Patient awake    Reviewed: Allergy & Precautions, NPO status , Patient's Chart, lab work & pertinent test results, reviewed documented beta blocker date and time   Airway Mallampati: II  TM Distance: >3 FB Neck ROM: Full    Dental no notable dental hx.    Pulmonary neg pulmonary ROS,    Pulmonary exam normal breath sounds clear to auscultation       Cardiovascular hypertension, Pt. on medications and Pt. on home beta blockers negative cardio ROS Normal cardiovascular exam Rhythm:Regular Rate:Normal     Neuro/Psych  Headaches, negative neurological ROS  negative psych ROS   GI/Hepatic negative GI ROS, Neg liver ROS, GERD  Medicated and Controlled,  Endo/Other  negative endocrine ROSdiabetes, Type 2, Oral Hypoglycemic AgentsObesity BMI 39  Renal/GU negative Renal ROS  negative genitourinary   Musculoskeletal negative musculoskeletal ROS (+) Arthritis , Osteoarthritis,    Abdominal (+) + obese,   Peds negative pediatric ROS (+)  Hematology negative hematology ROS (+) Hb 13.9, plt 171   Anesthesia Other Findings   Reproductive/Obstetrics negative OB ROS                                                             Anesthesia Evaluation  Patient identified by MRN, date of birth, ID band Patient awake    Reviewed: Allergy & Precautions, NPO status , Patient's Chart, lab work & pertinent test results  Airway Mallampati: II   Neck ROM: Full  Mouth opening: Limited Mouth Opening  Dental no notable dental hx. (+) Teeth Intact   Pulmonary neg pulmonary ROS,    breath sounds clear to auscultation       Cardiovascular hypertension,  Rhythm:Regular Rate:Normal     Neuro/Psych    GI/Hepatic   Endo/Other  diabetesMorbid obesity  Renal/GU      Musculoskeletal  (+) Arthritis ,   Abdominal (+) +  obese,   Peds  Hematology   Anesthesia Other Findings   Reproductive/Obstetrics                            Anesthesia Physical Anesthesia Plan  ASA: III  Anesthesia Plan: General   Post-op Pain Management:    Induction: Intravenous  Airway Management Planned: Oral ETT and Video Laryngoscope Planned  Additional Equipment:   Intra-op Plan:   Post-operative Plan: Extubation in OR  Informed Consent: I have reviewed the patients History and Physical, chart, labs and discussed the procedure including the risks, benefits and alternatives for the proposed anesthesia with the patient or authorized representative who has indicated his/her understanding and acceptance.     Plan Discussed with: CRNA  Anesthesia Plan Comments:        Anesthesia Quick Evaluation  Anesthesia Physical Anesthesia Plan  ASA: 3  Anesthesia Plan: General and Regional   Post-op Pain Management: Tylenol PO (pre-op)*, Regional block* and Minimal or no pain anticipated   Induction: Intravenous  PONV Risk Score and Plan: 2 and Ondansetron, Treatment may vary due to age or medical condition and Midazolam  Airway Management Planned: Oral ETT  Additional Equipment: None  Intra-op Plan:   Post-operative Plan: Extubation in OR  Informed Consent:   Plan Discussed  with:   Anesthesia Plan Comments:        Anesthesia Quick Evaluation

## 2022-02-16 ENCOUNTER — Observation Stay (HOSPITAL_COMMUNITY)
Admission: RE | Admit: 2022-02-16 | Discharge: 2022-02-17 | Disposition: A | Discharge status: Home / Self Care | Payer: Medicare Other | Source: Ambulatory Visit | Attending: Orthopedic Surgery | Admitting: Orthopedic Surgery

## 2022-02-16 ENCOUNTER — Ambulatory Visit (HOSPITAL_BASED_OUTPATIENT_CLINIC_OR_DEPARTMENT_OTHER): Payer: Medicare Other | Admitting: Certified Registered Nurse Anesthetist

## 2022-02-16 ENCOUNTER — Encounter (HOSPITAL_COMMUNITY)
Admission: RE | Disposition: A | Discharge status: Home / Self Care | Payer: Self-pay | Source: Ambulatory Visit | Attending: Orthopedic Surgery

## 2022-02-16 ENCOUNTER — Observation Stay (HOSPITAL_COMMUNITY): Payer: Medicare Other

## 2022-02-16 ENCOUNTER — Ambulatory Visit (HOSPITAL_COMMUNITY): Payer: Medicare Other | Admitting: Certified Registered Nurse Anesthetist

## 2022-02-16 ENCOUNTER — Other Ambulatory Visit: Payer: Self-pay

## 2022-02-16 ENCOUNTER — Encounter (HOSPITAL_COMMUNITY): Payer: Self-pay | Admitting: Orthopedic Surgery

## 2022-02-16 DIAGNOSIS — Z7982 Long term (current) use of aspirin: Secondary | ICD-10-CM | POA: Insufficient documentation

## 2022-02-16 DIAGNOSIS — Z7984 Long term (current) use of oral hypoglycemic drugs: Secondary | ICD-10-CM | POA: Diagnosis not present

## 2022-02-16 DIAGNOSIS — Z79899 Other long term (current) drug therapy: Secondary | ICD-10-CM | POA: Diagnosis not present

## 2022-02-16 DIAGNOSIS — M19011 Primary osteoarthritis, right shoulder: Secondary | ICD-10-CM | POA: Diagnosis present

## 2022-02-16 DIAGNOSIS — Z96611 Presence of right artificial shoulder joint: Secondary | ICD-10-CM

## 2022-02-16 DIAGNOSIS — I10 Essential (primary) hypertension: Secondary | ICD-10-CM

## 2022-02-16 DIAGNOSIS — E119 Type 2 diabetes mellitus without complications: Secondary | ICD-10-CM | POA: Diagnosis not present

## 2022-02-16 HISTORY — PX: REVERSE SHOULDER ARTHROPLASTY: SHX5054

## 2022-02-16 LAB — GLUCOSE, CAPILLARY
Glucose-Capillary: 102 mg/dL — ABNORMAL HIGH (ref 70–99)
Glucose-Capillary: 114 mg/dL — ABNORMAL HIGH (ref 70–99)
Glucose-Capillary: 106 mg/dL — ABNORMAL HIGH (ref 70–99)
Glucose-Capillary: 110 mg/dL — ABNORMAL HIGH (ref 70–99)
Glucose-Capillary: 117 mg/dL — ABNORMAL HIGH (ref 70–99)

## 2022-02-16 SURGERY — ARTHROPLASTY, SHOULDER, TOTAL, REVERSE
Anesthesia: Regional | Site: Shoulder | Laterality: Right

## 2022-02-16 MED ORDER — POLYETHYLENE GLYCOL 3350 17 G PO PACK: 17.0000 g | PACK | Freq: Every day | ORAL | Status: DC | PRN

## 2022-02-16 MED ORDER — INSULIN ASPART 100 UNIT/ML IJ SOLN
0.0000 [IU] | Freq: Three times a day (TID) | INTRAMUSCULAR | Status: DC
  Administered 2022-02-17: 3 [IU] via SUBCUTANEOUS

## 2022-02-16 MED ORDER — PHENYLEPHRINE 80 MCG/ML (10ML) SYRINGE FOR IV PUSH (FOR BLOOD PRESSURE SUPPORT)
PREFILLED_SYRINGE | INTRAVENOUS | Status: DC | PRN
  Administered 2022-02-16 (×3): 80 ug via INTRAVENOUS
  Administered 2022-02-16: 160 ug via INTRAVENOUS

## 2022-02-16 MED ORDER — BUPIVACAINE HCL (PF) 0.5 % IJ SOLN
INTRAMUSCULAR | Status: DC | PRN
  Administered 2022-02-16: 20 mL via PERINEURAL

## 2022-02-16 MED ORDER — ONDANSETRON HCL 4 MG/2ML IJ SOLN
INTRAMUSCULAR | Status: AC
  Filled 2022-02-16: qty 2

## 2022-02-16 MED ORDER — OXYCODONE HCL 5 MG PO TABS: 5.0000 mg | ORAL_TABLET | ORAL | Status: DC | PRN

## 2022-02-16 MED ORDER — ACETAMINOPHEN 500 MG PO TABS
1000.0000 mg | ORAL_TABLET | Freq: Once | ORAL | Status: AC
  Administered 2022-02-16: 1000 mg via ORAL
  Filled 2022-02-16: qty 2

## 2022-02-16 MED ORDER — PHENYLEPHRINE HCL (PRESSORS) 10 MG/ML IV SOLN
INTRAVENOUS | Status: AC
  Filled 2022-02-16: qty 1

## 2022-02-16 MED ORDER — EPHEDRINE SULFATE-NACL 50-0.9 MG/10ML-% IV SOSY
PREFILLED_SYRINGE | INTRAVENOUS | Status: DC | PRN
  Administered 2022-02-16: 10 mg via INTRAVENOUS
  Administered 2022-02-16 (×4): 5 mg via INTRAVENOUS

## 2022-02-16 MED ORDER — BUPIVACAINE-EPINEPHRINE (PF) 0.25% -1:200000 IJ SOLN
INTRAMUSCULAR | Status: DC | PRN
  Administered 2022-02-16: 20 mL

## 2022-02-16 MED ORDER — METFORMIN HCL 500 MG PO TABS
500.0000 mg | ORAL_TABLET | Freq: Two times a day (BID) | ORAL | Status: DC
  Administered 2022-02-16 – 2022-02-17 (×2): 500 mg via ORAL
  Filled 2022-02-16 (×2): qty 1

## 2022-02-16 MED ORDER — PHENOL 1.4 % MT LIQD: 1.0000 | OROMUCOSAL | Status: DC | PRN

## 2022-02-16 MED ORDER — ORAL CARE MOUTH RINSE: 15.0000 mL | Freq: Once | OROMUCOSAL | Status: AC

## 2022-02-16 MED ORDER — MEPERIDINE HCL 50 MG/ML IJ SOLN: 6.2500 mg | INTRAMUSCULAR | Status: DC | PRN

## 2022-02-16 MED ORDER — TIZANIDINE HCL 4 MG PO TABS
4.0000 mg | ORAL_TABLET | Freq: Three times a day (TID) | ORAL | 1 refills | Status: DC | PRN
Start: 2022-02-16 — End: 2023-02-01

## 2022-02-16 MED ORDER — FLUTICASONE PROPIONATE 50 MCG/ACT NA SUSP
1.0000 | Freq: Every day | NASAL | Status: DC | PRN
Start: 2022-02-16 — End: 2022-02-17

## 2022-02-16 MED ORDER — GLYCOPYRROLATE 0.2 MG/ML IJ SOLN
INTRAMUSCULAR | Status: DC | PRN
  Administered 2022-02-16 (×2): .1 mg via INTRAVENOUS

## 2022-02-16 MED ORDER — CEFAZOLIN SODIUM-DEXTROSE 2-4 GM/100ML-% IV SOLN
2.0000 g | Freq: Four times a day (QID) | INTRAVENOUS | Status: AC
  Administered 2022-02-16 – 2022-02-17 (×3): 2 g via INTRAVENOUS
  Filled 2022-02-16 (×3): qty 100

## 2022-02-16 MED ORDER — SUCCINYLCHOLINE CHLORIDE 200 MG/10ML IV SOSY
PREFILLED_SYRINGE | INTRAVENOUS | Status: DC | PRN
  Administered 2022-02-16: 120 mg via INTRAVENOUS

## 2022-02-16 MED ORDER — PHENYLEPHRINE HCL-NACL 20-0.9 MG/250ML-% IV SOLN
INTRAVENOUS | Status: DC | PRN
  Administered 2022-02-16: 40 ug/min via INTRAVENOUS

## 2022-02-16 MED ORDER — LOSARTAN POTASSIUM 50 MG PO TABS
50.0000 mg | ORAL_TABLET | Freq: Every day | ORAL | Status: DC
  Administered 2022-02-16 – 2022-02-17 (×2): 50 mg via ORAL
  Filled 2022-02-16 (×2): qty 1

## 2022-02-16 MED ORDER — ACETAMINOPHEN 325 MG PO TABS: 325.0000 mg | ORAL_TABLET | Freq: Four times a day (QID) | ORAL | Status: DC | PRN

## 2022-02-16 MED ORDER — MENTHOL 3 MG MT LOZG: 1.0000 | LOZENGE | OROMUCOSAL | Status: DC | PRN

## 2022-02-16 MED ORDER — ATORVASTATIN CALCIUM 10 MG PO TABS
10.0000 mg | ORAL_TABLET | Freq: Every evening | ORAL | Status: DC
  Administered 2022-02-16: 10 mg via ORAL
  Filled 2022-02-16: qty 1

## 2022-02-16 MED ORDER — LIDOCAINE HCL (PF) 2 % IJ SOLN
INTRAMUSCULAR | Status: AC
  Filled 2022-02-16: qty 5

## 2022-02-16 MED ORDER — MIDAZOLAM HCL 2 MG/2ML IJ SOLN
1.0000 mg | INTRAMUSCULAR | Status: DC
  Administered 2022-02-16: 2 mg via INTRAVENOUS
  Filled 2022-02-16: qty 2

## 2022-02-16 MED ORDER — PROMETHAZINE HCL 25 MG/ML IJ SOLN: 6.2500 mg | INTRAMUSCULAR | Status: DC | PRN

## 2022-02-16 MED ORDER — LORATADINE 10 MG PO TABS
10.0000 mg | ORAL_TABLET | Freq: Every day | ORAL | Status: DC | PRN
Start: 2022-02-16 — End: 2022-02-17

## 2022-02-16 MED ORDER — SUGAMMADEX SODIUM 200 MG/2ML IV SOLN
INTRAVENOUS | Status: DC | PRN
  Administered 2022-02-16: 250 mg via INTRAVENOUS

## 2022-02-16 MED ORDER — PHENYLEPHRINE 80 MCG/ML (10ML) SYRINGE FOR IV PUSH (FOR BLOOD PRESSURE SUPPORT)
PREFILLED_SYRINGE | INTRAVENOUS | Status: AC
  Filled 2022-02-16: qty 10

## 2022-02-16 MED ORDER — INSULIN ASPART 100 UNIT/ML IJ SOLN
6.0000 [IU] | Freq: Three times a day (TID) | INTRAMUSCULAR | Status: DC
Start: 2022-02-16 — End: 2022-02-17

## 2022-02-16 MED ORDER — 0.9 % SODIUM CHLORIDE (POUR BTL) OPTIME
TOPICAL | Status: DC | PRN
  Administered 2022-02-16: 1000 mL

## 2022-02-16 MED ORDER — PROPOFOL 10 MG/ML IV BOLUS
INTRAVENOUS | Status: DC | PRN
  Administered 2022-02-16: 200 mg via INTRAVENOUS
  Administered 2022-02-16: 20 mg via INTRAVENOUS

## 2022-02-16 MED ORDER — CEFAZOLIN IN SODIUM CHLORIDE 3-0.9 GM/100ML-% IV SOLN
3.0000 g | INTRAVENOUS | Status: AC
  Administered 2022-02-16: 3 g via INTRAVENOUS
  Filled 2022-02-16: qty 100

## 2022-02-16 MED ORDER — ONDANSETRON HCL 4 MG PO TABS: 4.0000 mg | ORAL_TABLET | Freq: Three times a day (TID) | ORAL | 1 refills | Status: DC | PRN

## 2022-02-16 MED ORDER — AMLODIPINE BESYLATE 10 MG PO TABS
10.0000 mg | ORAL_TABLET | Freq: Every day | ORAL | Status: DC
  Administered 2022-02-17: 10 mg via ORAL
  Filled 2022-02-16: qty 1

## 2022-02-16 MED ORDER — ASPIRIN 81 MG PO TBEC
81.0000 mg | DELAYED_RELEASE_TABLET | Freq: Every day | ORAL | Status: DC
  Administered 2022-02-16 – 2022-02-17 (×2): 81 mg via ORAL
  Filled 2022-02-16 (×2): qty 1

## 2022-02-16 MED ORDER — OXYCODONE HCL 5 MG PO TABS
ORAL_TABLET | ORAL | Status: AC
  Filled 2022-02-16: qty 1

## 2022-02-16 MED ORDER — ACETAMINOPHEN 500 MG PO TABS
1000.0000 mg | ORAL_TABLET | Freq: Three times a day (TID) | ORAL | Status: DC | PRN
  Administered 2022-02-16 – 2022-02-17 (×2): 1000 mg via ORAL
  Filled 2022-02-16 (×2): qty 2

## 2022-02-16 MED ORDER — METHOCARBAMOL 500 MG IVPB - SIMPLE MED
500.0000 mg | Freq: Four times a day (QID) | INTRAVENOUS | Status: DC | PRN
  Filled 2022-02-16: qty 55

## 2022-02-16 MED ORDER — METHOCARBAMOL 500 MG PO TABS
500.0000 mg | ORAL_TABLET | Freq: Four times a day (QID) | ORAL | Status: DC | PRN
  Administered 2022-02-16 (×2): 500 mg via ORAL
  Filled 2022-02-16 (×2): qty 1

## 2022-02-16 MED ORDER — BUPIVACAINE LIPOSOME 1.3 % IJ SUSP
INTRAMUSCULAR | Status: DC | PRN
  Administered 2022-02-16: 10 mL via PERINEURAL

## 2022-02-16 MED ORDER — FAMOTIDINE 20 MG PO TABS
20.0000 mg | ORAL_TABLET | Freq: Every evening | ORAL | Status: DC
  Administered 2022-02-16: 20 mg via ORAL
  Filled 2022-02-16: qty 1

## 2022-02-16 MED ORDER — HYDROMORPHONE HCL 1 MG/ML IJ SOLN
INTRAMUSCULAR | Status: AC
  Filled 2022-02-16: qty 1

## 2022-02-16 MED ORDER — ATENOLOL 50 MG PO TABS
50.0000 mg | ORAL_TABLET | Freq: Two times a day (BID) | ORAL | Status: DC
  Administered 2022-02-16 – 2022-02-17 (×2): 50 mg via ORAL
  Filled 2022-02-16 (×2): qty 1

## 2022-02-16 MED ORDER — HYDROMORPHONE HCL 1 MG/ML IJ SOLN: 0.2500 mg | INTRAMUSCULAR | Status: DC | PRN

## 2022-02-16 MED ORDER — ONDANSETRON HCL 4 MG/2ML IJ SOLN: 4.0000 mg | Freq: Four times a day (QID) | INTRAMUSCULAR | Status: DC | PRN

## 2022-02-16 MED ORDER — ALFUZOSIN HCL ER 10 MG PO TB24
10.0000 mg | ORAL_TABLET | Freq: Every evening | ORAL | Status: DC
  Administered 2022-02-16: 10 mg via ORAL
  Filled 2022-02-16: qty 1

## 2022-02-16 MED ORDER — OXYCODONE-ACETAMINOPHEN 7.5-325 MG PO TABS: 1.0000 | ORAL_TABLET | ORAL | 0 refills | Status: DC | PRN

## 2022-02-16 MED ORDER — METOCLOPRAMIDE HCL 5 MG/ML IJ SOLN: 5.0000 mg | Freq: Three times a day (TID) | INTRAMUSCULAR | Status: DC | PRN

## 2022-02-16 MED ORDER — CEFAZOLIN SODIUM-DEXTROSE 2-4 GM/100ML-% IV SOLN: 2.0000 g | INTRAVENOUS | Status: DC

## 2022-02-16 MED ORDER — ALLOPURINOL 300 MG PO TABS
150.0000 mg | ORAL_TABLET | Freq: Every day | ORAL | Status: DC
  Administered 2022-02-16: 150 mg via ORAL
  Filled 2022-02-16: qty 1

## 2022-02-16 MED ORDER — FENTANYL CITRATE PF 50 MCG/ML IJ SOSY
50.0000 ug | PREFILLED_SYRINGE | INTRAMUSCULAR | Status: DC
  Administered 2022-02-16: 50 ug via INTRAVENOUS
  Filled 2022-02-16: qty 2

## 2022-02-16 MED ORDER — CHLORHEXIDINE GLUCONATE 0.12 % MT SOLN
15.0000 mL | Freq: Once | OROMUCOSAL | Status: AC
  Administered 2022-02-16: 15 mL via OROMUCOSAL

## 2022-02-16 MED ORDER — HYDROMORPHONE HCL 1 MG/ML IJ SOLN
0.2500 mg | INTRAMUSCULAR | Status: DC | PRN
  Administered 2022-02-16 (×2): 0.5 mg via INTRAVENOUS

## 2022-02-16 MED ORDER — AMISULPRIDE (ANTIEMETIC) 5 MG/2ML IV SOLN: 10.0000 mg | Freq: Once | INTRAVENOUS | Status: DC | PRN

## 2022-02-16 MED ORDER — DOCUSATE SODIUM 100 MG PO CAPS
100.0000 mg | ORAL_CAPSULE | Freq: Two times a day (BID) | ORAL | Status: DC
  Administered 2022-02-16 – 2022-02-17 (×2): 100 mg via ORAL
  Filled 2022-02-16 (×2): qty 1

## 2022-02-16 MED ORDER — ONDANSETRON HCL 4 MG PO TABS: 4.0000 mg | ORAL_TABLET | Freq: Four times a day (QID) | ORAL | Status: DC | PRN

## 2022-02-16 MED ORDER — ORAL CARE MOUTH RINSE: 15.0000 mL | OROMUCOSAL | Status: DC | PRN

## 2022-02-16 MED ORDER — HYDROMORPHONE HCL 1 MG/ML IJ SOLN: 0.5000 mg | INTRAMUSCULAR | Status: DC | PRN

## 2022-02-16 MED ORDER — OXYCODONE HCL 5 MG/5ML PO SOLN: 5.0000 mg | Freq: Once | ORAL | Status: AC | PRN

## 2022-02-16 MED ORDER — PANTOPRAZOLE SODIUM 40 MG PO TBEC
40.0000 mg | DELAYED_RELEASE_TABLET | Freq: Every day | ORAL | Status: DC
  Administered 2022-02-17: 40 mg via ORAL
  Filled 2022-02-16: qty 1

## 2022-02-16 MED ORDER — OXYCODONE HCL 5 MG PO TABS
10.0000 mg | ORAL_TABLET | ORAL | Status: DC | PRN
  Administered 2022-02-16: 10 mg via ORAL
  Administered 2022-02-16 – 2022-02-17 (×3): 15 mg via ORAL
  Filled 2022-02-16 (×3): qty 3
  Filled 2022-02-16: qty 2

## 2022-02-16 MED ORDER — LACTATED RINGERS IV SOLN: INTRAVENOUS | Status: DC

## 2022-02-16 MED ORDER — OXYCODONE HCL 5 MG PO TABS
5.0000 mg | ORAL_TABLET | Freq: Once | ORAL | Status: AC | PRN
  Administered 2022-02-16: 5 mg via ORAL

## 2022-02-16 MED ORDER — SODIUM CHLORIDE 0.9 % IV SOLN: INTRAVENOUS | Status: DC

## 2022-02-16 MED ORDER — ONDANSETRON HCL 4 MG/2ML IJ SOLN
INTRAMUSCULAR | Status: DC | PRN
  Administered 2022-02-16: 4 mg via INTRAVENOUS

## 2022-02-16 MED ORDER — INSULIN ASPART 100 UNIT/ML IJ SOLN: 0.0000 [IU] | Freq: Every day | INTRAMUSCULAR | Status: DC

## 2022-02-16 MED ORDER — EPHEDRINE 5 MG/ML INJ
INTRAVENOUS | Status: AC
  Filled 2022-02-16: qty 5

## 2022-02-16 MED ORDER — BUPIVACAINE-EPINEPHRINE (PF) 0.25% -1:200000 IJ SOLN
INTRAMUSCULAR | Status: AC
  Filled 2022-02-16: qty 30

## 2022-02-16 MED ORDER — ROCURONIUM BROMIDE 100 MG/10ML IV SOLN
INTRAVENOUS | Status: DC | PRN
  Administered 2022-02-16: 5 mg via INTRAVENOUS
  Administered 2022-02-16: 20 mg via INTRAVENOUS
  Administered 2022-02-16: 10 mg via INTRAVENOUS

## 2022-02-16 MED ORDER — METOCLOPRAMIDE HCL 5 MG PO TABS: 5.0000 mg | ORAL_TABLET | Freq: Three times a day (TID) | ORAL | Status: DC | PRN

## 2022-02-16 MED ORDER — OXYCODONE HCL 5 MG/5ML PO SOLN: 5.0000 mg | Freq: Once | ORAL | Status: DC | PRN

## 2022-02-16 MED ORDER — STERILE WATER FOR IRRIGATION IR SOLN
Status: DC | PRN
  Administered 2022-02-16: 2000 mL

## 2022-02-16 MED ORDER — OXYCODONE HCL 5 MG PO TABS: 5.0000 mg | ORAL_TABLET | Freq: Once | ORAL | Status: DC | PRN

## 2022-02-16 MED ORDER — PROPOFOL 10 MG/ML IV BOLUS
INTRAVENOUS | Status: AC
  Filled 2022-02-16: qty 20

## 2022-02-16 MED ORDER — LIDOCAINE 2% (20 MG/ML) 5 ML SYRINGE
INTRAMUSCULAR | Status: DC | PRN
  Administered 2022-02-16: 100 mg via INTRAVENOUS

## 2022-02-16 MED ORDER — ONDANSETRON HCL 4 MG/2ML IJ SOLN: 4.0000 mg | Freq: Once | INTRAMUSCULAR | Status: DC | PRN

## 2022-02-16 MED ORDER — ROCURONIUM BROMIDE 10 MG/ML (PF) SYRINGE
PREFILLED_SYRINGE | INTRAVENOUS | Status: AC
  Filled 2022-02-16: qty 10

## 2022-02-16 SURGICAL SUPPLY — 74 items
BAG COUNTER SPONGE SURGICOUNT (BAG) ×1 IMPLANT
BAG ZIPLOCK 12X15 (MISCELLANEOUS) IMPLANT
BIT DRILL 1.6MX128 (BIT) ×1 IMPLANT
BIT DRILL 170X2.5X (BIT) IMPLANT
BIT DRL 170X2.5X (BIT) ×1
BLADE SAG 18X100X1.27 (BLADE) ×2 IMPLANT
CLSR STERI-STRIP ANTIMIC 1/2X4 (GAUZE/BANDAGES/DRESSINGS) ×1 IMPLANT
COVER BACK TABLE 60X90IN (DRAPES) ×2 IMPLANT
COVER SURGICAL LIGHT HANDLE (MISCELLANEOUS) ×2 IMPLANT
DRAPE INCISE IOBAN 66X45 STRL (DRAPES) ×2 IMPLANT
DRAPE ORTHO SPLIT 77X108 STRL (DRAPES) ×2
DRAPE SHEET LG 3/4 BI-LAMINATE (DRAPES) ×2 IMPLANT
DRAPE SURG ORHT 6 SPLT 77X108 (DRAPES) ×2 IMPLANT
DRAPE TOP 10253 STERILE (DRAPES) ×2 IMPLANT
DRAPE U-SHAPE 47X51 STRL (DRAPES) ×2 IMPLANT
DRILL 2.5 (BIT) ×1
DRSG ADAPTIC 3X8 NADH LF (GAUZE/BANDAGES/DRESSINGS) ×2 IMPLANT
DRSG PAD ABDOMINAL 8X10 ST (GAUZE/BANDAGES/DRESSINGS) ×2 IMPLANT
DURAPREP 26ML APPLICATOR (WOUND CARE) ×2 IMPLANT
ELECT BLADE TIP CTD 4 INCH (ELECTRODE) ×2 IMPLANT
ELECT NDL TIP 2.8 STRL (NEEDLE) ×1 IMPLANT
ELECT NEEDLE TIP 2.8 STRL (NEEDLE) ×2 IMPLANT
ELECT REM PT RETURN 15FT ADLT (MISCELLANEOUS) ×2 IMPLANT
EPIPHYSI RIGHT SZ 2 (Shoulder) ×2 IMPLANT
EPIPHYSIS RIGHT SZ 2 (Shoulder) IMPLANT
FACESHIELD WRAPAROUND (MASK) ×2 IMPLANT
FACESHIELD WRAPAROUND OR TEAM (MASK) ×1 IMPLANT
GAUZE SPONGE 4X4 12PLY STRL (GAUZE/BANDAGES/DRESSINGS) ×2 IMPLANT
GLENOSPHERE XTEND LAT 42+0 STD (Miscellaneous) ×1 IMPLANT
GLOVE BIOGEL PI IND STRL 7.5 (GLOVE) ×1 IMPLANT
GLOVE BIOGEL PI IND STRL 8.5 (GLOVE) ×1 IMPLANT
GLOVE BIOGEL PI INDICATOR 7.5 (GLOVE) ×1
GLOVE BIOGEL PI INDICATOR 8.5 (GLOVE) ×1
GLOVE ORTHO TXT STRL SZ7.5 (GLOVE) ×2 IMPLANT
GLOVE SURG ORTHO 8.5 STRL (GLOVE) ×2 IMPLANT
GOWN STRL REUS W/ TWL XL LVL3 (GOWN DISPOSABLE) ×2 IMPLANT
GOWN STRL REUS W/TWL XL LVL3 (GOWN DISPOSABLE) ×2
KIT BASIN OR (CUSTOM PROCEDURE TRAY) ×2 IMPLANT
KIT TURNOVER KIT A (KITS) ×1 IMPLANT
MANIFOLD NEPTUNE II (INSTRUMENTS) ×2 IMPLANT
METAGLENE DELTA EXTEND (Trauma) IMPLANT
METAGLENE DXTEND (Trauma) ×2 IMPLANT
NDL MAYO CATGUT SZ4 TPR NDL (NEEDLE) ×1 IMPLANT
NEEDLE MAYO CATGUT SZ4 (NEEDLE) ×2 IMPLANT
NS IRRIG 1000ML POUR BTL (IV SOLUTION) ×2 IMPLANT
PACK SHOULDER (CUSTOM PROCEDURE TRAY) ×2 IMPLANT
PIN GUIDE 1.2 (PIN) ×1 IMPLANT
PIN GUIDE GLENOPHERE 1.5MX300M (PIN) ×1 IMPLANT
PIN METAGLENE 2.5 (PIN) ×1 IMPLANT
PROTECTOR NERVE ULNAR (MISCELLANEOUS) ×2 IMPLANT
RESTRAINT HEAD UNIVERSAL NS (MISCELLANEOUS) ×2 IMPLANT
SCREW 4.5X18MM (Screw) ×2 IMPLANT
SCREW 4.5X24MM (Screw) ×1 IMPLANT
SCREW BN 18X4.5XSTRL SHLDR (Screw) IMPLANT
SCREW BN 24X4.5XLCK STRL (Screw) IMPLANT
SCREW LOCK 42 (Screw) ×1 IMPLANT
SLING ARM FOAM STRAP LRG (SOFTGOODS) ×1 IMPLANT
SMARTMIX MINI TOWER (MISCELLANEOUS)
SPACER 42 PLUS 6 (Orthopedic Implant) ×1 IMPLANT
SPIKE FLUID TRANSFER (MISCELLANEOUS) ×2 IMPLANT
SPONGE T-LAP 4X18 ~~LOC~~+RFID (SPONGE) ×2 IMPLANT
STEM DELTA DIA 10 HA (Stem) ×1 IMPLANT
STRIP CLOSURE SKIN 1/2X4 (GAUZE/BANDAGES/DRESSINGS) ×2 IMPLANT
SUCTION FRAZIER HANDLE 10FR (MISCELLANEOUS) ×1
SUCTION TUBE FRAZIER 10FR DISP (MISCELLANEOUS) ×1 IMPLANT
SUT FIBERWIRE #2 38 T-5 BLUE (SUTURE) ×4
SUT MNCRL AB 4-0 PS2 18 (SUTURE) ×2 IMPLANT
SUT VIC AB 0 CT1 36 (SUTURE) ×4 IMPLANT
SUT VIC AB 0 CT2 27 (SUTURE) ×2 IMPLANT
SUT VIC AB 2-0 CT1 27 (SUTURE) ×1
SUT VIC AB 2-0 CT1 TAPERPNT 27 (SUTURE) ×1 IMPLANT
SUTURE FIBERWR #2 38 T-5 BLUE (SUTURE) ×2 IMPLANT
TOWEL OR 17X26 10 PK STRL BLUE (TOWEL DISPOSABLE) ×2 IMPLANT
TOWER SMARTMIX MINI (MISCELLANEOUS) IMPLANT

## 2022-02-16 NOTE — Op Note (Unsigned)
[name], Kevin Jones MEDICAL RECORD NO: [id number] ACCOUNT NO: [id number] DATE OF BIRTH: [birth year] FACILITY: [facility] LOCATION: [id number] PHYSICIAN: Almedia Balls. Ranell Patrick, MD  Operative Report   DATE OF PROCEDURE: 02/16/2022  PREOPERATIVE DIAGNOSIS:  Right shoulder end-stage arthritis.  POSTOPERATIVE DIAGNOSIS:  Right shoulder end-stage arthritis.  PROCEDURE PERFORMED:  Right reverse shoulder replacement using DePuy Delta Xtend prosthesis with no subscap repair.  ATTENDING SURGEON:  Almedia Balls. Ranell Patrick, MD  ASSISTANT:  Modesto Charon, MD, PA-C, who was scrubbed during the entire procedure, and was necessary for satisfactory completion of surgery.  ANESTHESIA:  General anesthesia was used plus interscalene block.  ESTIMATED BLOOD LOSS:  350 mL.  FLUID REPLACEMENT:  1500 mL crystalloid.  INSTRUMENT COUNTS:  Correct.  COMPLICATIONS:  No complications.  ANTIBIOTICS:  Perioperative antibiotics were given.  INDICATIONS:  The patient is a 76 year old male who presents with worsening shoulder pain secondary to end-stage arthritis.  The patient has subscap insufficiency and thus poor function and range of motion.  Thus, we elected to proceed with reverse  shoulder replacement.  The patient did agree with the surgery to relieve pain and restore function.  Informed consent obtained.  DESCRIPTION OF PROCEDURE:  After an adequate level of anesthesia was achieved, the patient was positioned in the modified beach chair position.  The right shoulder was correctly identified and sterilely prepped and draped in the usual manner.  Timeout  called, verifying correct patient, correct site, We entered the patient's shoulder using standard deltopectoral approach, starting at the coracoid process, extending down to the anterior humerus.  Dissection down through subcutaneous tissues using Bovie.   We identified the cephalic vein and took that laterally with the deltoid pectoralis taken medially.  Conjoined tendon  identified and retracted medially.  Biceps tenodesed in situ with 0 Vicryl figure-of-eight suture.  Next, we went ahead and placed our  deep retractors.  We released the subscapularis remnant off the lesser tuberosity.  This was not repairable, but we did tag for protection of the axillary nerve.  We then released the inferior capsule extending the shoulder and delivering the humeral  head out of the wound.  We entered the proximal humerus and there was no cartilage on the humeral head at all or on the glenoid.  We entered the proximal humerus with a 6 mm reamer, reaming up to a size 10.  We then used our 10 mm T-handle guide and  resected the head at 20 degrees of retroversion with the oscillating saw.  We next used a large rongeur to remove the osteophytes medially.  We subluxed the humerus posteriorly, gaining good exposure of the glenoid face.  We removed the labrum, capsule  and biceps stump.  At this point, we found our center point for a guide pin and placed our guide pin centered low on the glenoid face angled slightly inferiorly.  We then reamed for the metaglene baseplate, drilled out our central peg hole, did our  peripheral hand reaming and then impacted the real baseplate into position.  We placed a 42 screw inferiorly, a 24 superiorly locking this 2 locking screws and then placed 18 nonlocked screws anteriorly and posteriorly.  We had great baseplate stability  and security and support.  We then went ahead and placed our 42 standard glenosphere on the baseplate and secured that with a screwdriver.  I did a finger sweep to make sure we had no soft tissue caught up in that bearing.  Next, we went to the humeral  side,  reaming for the 2 right metaphysis.  We then trialled with the 10 body to right set on the 0 setting and placed in 20 degrees of retroversion.  We reduced the shoulder with a 42 +3 poly.  We were happy with that, but felt like we could get the +6  in with the real.  We removed the  trial components from the humeral side, irrigated thoroughly and then used available bone graft from the humeral head in impaction grafting technique to impact the stem into place.  We used the press-fit HA coated 10  stem and the 2 right metaphysis again set on the 0 setting and placed in 20 degrees of retroversion.  Once the stem was secured, we selected the real 42 +6 poly and placed on the humeral tray impacted that to set it in the 4 to lock and then we reduced  the shoulder, had nice little pop as it reduced and appropriate tension on the shoulder throughout, we had a full range of motion, no gapping, no instability.  We irrigated thoroughly, resected the subscap remnant and then repaired deltopectoral interval  with 0 Vicryl suture followed by 2-0 Vicryl for subcutaneous closure and 4-0 Monocryl for skin.  Steri-Strips applied followed by sterile dressing.  The patient tolerated surgery well.   MUK D: 02/16/2022 11:04:56 am T: 02/16/2022 10:40:00 pm  JOB: [id number]/ [id number]

## 2022-02-16 NOTE — Transfer of Care (Signed)
Immediate Anesthesia Transfer of Care Note  Patient: Kevin Jones  Procedure(s) Performed: REVERSE SHOULDER ARTHROPLASTY (Right: Shoulder)  Patient Location: PACU  Anesthesia Type:General and Regional  Level of Consciousness: drowsy and patient cooperative  Airway & Oxygen Therapy: Patient Spontanous Breathing and Patient connected to face mask oxygen  Post-op Assessment: Report given to RN and Post -op Vital signs reviewed and stable  Post vital signs: Reviewed and stable  Last Vitals:  Vitals Value Taken Time  BP 127/70 02/16/22 1113  Temp    Pulse 56 02/16/22 1116  Resp 16 02/16/22 1116  SpO2 96 % 02/16/22 1116  Vitals shown include unvalidated device data.  Last Pain:  Vitals:   02/16/22 0746  TempSrc: Oral  PainSc: 0-No pain         Complications: No notable events documented.

## 2022-02-16 NOTE — Discharge Instructions (Signed)
Ice to the shoulder constantly.  Keep the incision covered and clean and dry for one week, then ok to get it wet in the shower. ° °Do exercise as instructed several times per day. ° °DO NOT reach behind your back or push up out of a chair with the operative arm. ° °Use a sling while you are up and around for comfort, may remove while seated.  Keep pillow propped behind the operative elbow. ° °Follow up with Dr Alvis Pulcini in two weeks in the office, call [phone] for appt °

## 2022-02-16 NOTE — Anesthesia Procedure Notes (Signed)
Procedure Name: Intubation Date/Time: 02/16/2022 9:31 AM  Performed by: Marny Lowenstein, CRNAPre-anesthesia Checklist: Patient identified, Emergency Drugs available, Suction available and Patient being monitored Patient Re-evaluated:Patient Re-evaluated prior to induction Oxygen Delivery Method: Circle system utilized Preoxygenation: Pre-oxygenation with 100% oxygen Induction Type: IV induction Ventilation: Mask ventilation without difficulty and Oral airway inserted - appropriate to patient size Laryngoscope Size: Hyacinth Meeker and 2 Grade View: Grade I Tube type: Oral Tube size: 7.5 mm Number of attempts: 1 Airway Equipment and Method: Patient positioned with wedge pillow and Stylet Placement Confirmation: ETT inserted through vocal cords under direct vision, positive ETCO2 and breath sounds checked- equal and bilateral Secured at: 21 cm Tube secured with: Tape Dental Injury: Teeth and Oropharynx as per pre-operative assessment

## 2022-02-16 NOTE — Progress Notes (Signed)
Pacu RN Report to floor given  Gave report to [employer]. Room: 1-332    Discussed surgery, meds given in OR and Pacu, VS, IV fluids given, EBL, urine output, pain and other pertinent information. Also discussed if pt had any family or friends here or belongings with them.   Pt had a block for R shoulder, starting to have pin/needles/pain, treated w/ Dilaudid and Oxycodone as documented. +CSM to R fingers, +2 Radial pulse, moving fingers. ICE to shoulder. Sling in place, dressing CDI. Xray was done. VSS.   Wife is at home, have been sending text updates, will call her with the room number.   Pt exits my care.

## 2022-02-16 NOTE — Anesthesia Procedure Notes (Signed)
Anesthesia Regional Block: Interscalene brachial plexus block   Pre-Anesthetic Checklist: , timeout performed,  Correct Patient, Correct Site, Correct Laterality,  Correct Procedure, Correct Position, site marked,  Risks and benefits discussed,  Surgical consent,  Pre-op evaluation,  At surgeon's request and post-op pain management  Laterality: Right  Prep: chloraprep       Needles:  Injection technique: Single-shot  Needle Type: Stimiplex     Needle Length: 9cm  Needle Gauge: 21     Additional Needles:   Procedures:,,,, ultrasound used (permanent image in chart),,    Narrative:  Start time: 02/16/2022 8:40 AM End time: 02/16/2022 8:45 AM Injection made incrementally with aspirations every 5 mL.  Performed by: Personally  Anesthesiologist: Lowella Curb, MD

## 2022-02-16 NOTE — Interval H&P Note (Signed)
History and Physical Interval Note:  02/16/2022 9:07 AM  Kevin Jones  has presented today for surgery, with the diagnosis of right shoulder osteoarthritis.  The various methods of treatment have been discussed with the patient and family. After consideration of risks, benefits and other options for treatment, the patient has consented to  Procedure(s) with comments: REVERSE SHOULDER ARTHROPLASTY (Right) - with ISB as a surgical intervention.  The patient's history has been reviewed, patient examined, no change in status, stable for surgery.  I have reviewed the patient's chart and labs.  Questions were answered to the patient's satisfaction.     Verlee Rossetti

## 2022-02-16 NOTE — Anesthesia Postprocedure Evaluation (Signed)
Anesthesia Post Note  Patient: Kevin Jones  Procedure(s) Performed: REVERSE SHOULDER ARTHROPLASTY (Right: Shoulder)     Patient location during evaluation: PACU Anesthesia Type: Regional and General Level of consciousness: awake and alert Pain management: pain level controlled Vital Signs Assessment: post-procedure vital signs reviewed and stable Respiratory status: spontaneous breathing, nonlabored ventilation and respiratory function stable Cardiovascular status: blood pressure returned to baseline and stable Postop Assessment: no apparent nausea or vomiting Anesthetic complications: no   No notable events documented.  Last Vitals:  Vitals:   02/16/22 1225 02/16/22 1227  BP:  (!) 135/58  Pulse: (!) 58 (!) 55  Resp: 16 14  Temp:  36.7 C  SpO2: 95% 97%    Last Pain:  Vitals:   02/16/22 1227  TempSrc:   PainSc: 3                  Lowella Curb

## 2022-02-16 NOTE — Care Plan (Signed)
Ortho Bundle Case Management Note  Patient Details  Name: Kevin Jones MRN: [id number] Date of Birth: July 22, 1946                  R Rev TSA on 02-16-22 DCP: Home with wife DME: Sling and CTU provided by the hospital PT: HEP   DME Arranged:  N/A DME Agency:     HH Arranged:    HH Agency:     Additional Comments: Please contact me with any questions of if this plan should need to change.  Ennis Forts, RN,CCM [facility]  [phone] 02/16/2022, 9:59 AM

## 2022-02-16 NOTE — Brief Op Note (Signed)
02/16/2022  10:59 AM  PATIENT:  Kevin Jones  76 y.o. male  PRE-OPERATIVE DIAGNOSIS:  right shoulder osteoarthritis, end stage  POST-OPERATIVE DIAGNOSIS:  right shoulder osteoarthritis, end stage  PROCEDURE:  Procedure(s) with comments: REVERSE SHOULDER ARTHROPLASTY (Right) - with ISB DePuy Delta Xtend, NO subscap repair  SURGEON:  Surgeon(s) and Role:    Beverely Low, MD - Primary  PHYSICIAN ASSISTANT:   ASSISTANTS: Thea Gist, PA-C   ANESTHESIA:   regional and general  EBL:  350 mL   BLOOD ADMINISTERED:none  DRAINS: none   LOCAL MEDICATIONS USED:  MARCAINE     SPECIMEN:  No Specimen  DISPOSITION OF SPECIMEN:  N/A  COUNTS:  YES  TOURNIQUET:  * No tourniquets in log *  DICTATION: .Other Dictation: Dictation Number [id number]  PLAN OF CARE: Admit for overnight observation  PATIENT DISPOSITION:  PACU - hemodynamically stable.   Delay start of Pharmacological VTE agent (>24hrs) due to surgical blood loss or risk of bleeding: not applicable

## 2022-02-17 DIAGNOSIS — M19011 Primary osteoarthritis, right shoulder: Secondary | ICD-10-CM | POA: Diagnosis not present

## 2022-02-17 LAB — BASIC METABOLIC PANEL
Anion gap: 9 (ref 5–15)
BUN: 17 mg/dL (ref 8–23)
CO2: 23 mmol/L (ref 22–32)
Calcium: 8.2 mg/dL — ABNORMAL LOW (ref 8.9–10.3)
Chloride: 106 mmol/L (ref 98–111)
Creatinine, Ser: 0.92 mg/dL (ref 0.61–1.24)
GFR, Estimated: >60 mL/min (ref >60)
Glucose, Bld: 99 mg/dL (ref 70–99)
Potassium: 4.1 mmol/L (ref 3.5–5.1)
Sodium: 138 mmol/L (ref 135–145)

## 2022-02-17 LAB — HEMOGLOBIN AND HEMATOCRIT, BLOOD
HCT: 40.6 % (ref 39.0–52.0)
Hemoglobin: 13.2 g/dL (ref 13.0–17.0)

## 2022-02-17 LAB — GLUCOSE, CAPILLARY: Glucose-Capillary: 138 mg/dL — ABNORMAL HIGH (ref 70–99)

## 2022-02-17 NOTE — Progress Notes (Signed)
RN reviewed discharge instructions with patient and family. All questions answered.   Paperwork given. Prescriptions electronically sent to patient pharmacy.    NT rolled patient down with all belongings to family car.     Tova Vater, RN  

## 2022-02-17 NOTE — Progress Notes (Signed)
   Subjective: 1 Day Post-Op Procedure(s) (LRB): REVERSE SHOULDER ARTHROPLASTY (Right)  C/o mild soreness/pain this morning C/o mild urinary retention but denies any pain Otherwise doing well Plan for d/c home today Patient reports pain as mild.  Objective:   VITALS:   Vitals:   02/17/22 0306 02/17/22 0619  BP: 125/64 (!) 131/58  Pulse: 73 78  Resp: 18 18  Temp: 98.2 F (36.8 C) 98.1 F (36.7 C)  SpO2: 93% 92%    Right shoulder incision healing well Nv intact distally Dressing changed No drainage or erythema Sling in good position  LABS Recent Labs    02/17/22 0332  HGB 13.2  HCT 40.6    Recent Labs    02/17/22 0332  NA 138  K 4.1  BUN 17  CREATININE 0.92  GLUCOSE 99     Assessment/Plan: 1 Day Post-Op Procedure(s) (LRB): REVERSE SHOULDER ARTHROPLASTY (Right) Plan to d/c home today after therapy F/u in  2 weeks in the office Pain management  Pulmonary toilet     Brad Antonieta Iba, MPAS [facility] is now [facility]  [place] [address], [place], [place] [address] Phone: [phone] [email or web address] Facebook  [employer]

## 2022-02-17 NOTE — Clinical Social Work Note (Signed)
  Transition of Care [facility]) Screening Note   Patient Details  Name: Kevin Jones Date of Birth: Feb 06, 1946   Transition of [facility]) CM/SW Contact:    Darleene Cleaver, LCSW Phone Number: 02/17/2022, 10:53 AM    Transition of Care Department [facility]) has reviewed patient and no TOC needs have been identified at this time. We will continue to monitor patient advancement through interdisciplinary progression rounds. If new patient transition needs arise, please place a TOC consult.

## 2022-02-17 NOTE — Evaluation (Signed)
Occupational Therapy Evaluation Patient Details Name: Kevin Jones MRN: [id number] DOB: 1945/10/13 Today's Date: 02/17/2022   History of Present Illness Patient s/p right reverse shoulder replacement.   Clinical Impression   Kevin Jones is a 76 year old man s/p shoulder replacement without functional use of right dominant upper extremity secondary to effects of surgery and interscalene block and shoulder precautions. Therapist provided education and instruction to patient and spouse (on phone) in regards to exercises, precautions, positioning, donning upper extremity clothing and bathing while maintaining shoulder precautions, ice and edema management and donning/doffing sling. Patient and spouse verbalized understanding and demonstrated as needed.Patient to follow up with MD for further therapy needs.        Recommendations for follow up therapy are one component of a multi-disciplinary discharge planning process, led by the attending physician.  Recommendations may be updated based on patient status, additional functional criteria and insurance authorization.   Follow Up Recommendations  Follow physician's recommendations for discharge plan and follow up therapies    Assistance Recommended at Discharge Intermittent Supervision/Assistance  Patient can return home with the following A little help with bathing/dressing/bathroom;Assistance with cooking/housework    Functional Status Assessment  Patient has had a recent decline in their functional status and demonstrates the ability to make significant improvements in function in a reasonable and predictable amount of time.  Equipment Recommendations  None recommended by OT    Recommendations for Other Services       Precautions / Restrictions Precautions Precautions: Shoulder Type of Shoulder Precautions: Per MD order: No active movement, no passive movement.  Per patient MD reports he can use it in front of him with no resistance and  for ADLs. Shoulder Interventions: Shoulder sling/immobilizer;Off for dressing/bathing/exercises Precaution Booklet Issued: Yes (comment) Required Braces or Orthoses: Sling Restrictions Weight Bearing Restrictions: Yes RUE Weight Bearing: Non weight bearing      Mobility Bed Mobility               General bed mobility comments: up in recliner    Transfers Overall transfer level: Independent                 General transfer comment: ambulated in room without phyical assistance. No overt loss of balance.      Balance Overall balance assessment: No apparent balance deficits (not formally assessed)                                             Vision Patient Visual Report: No change from baseline       Perception     Praxis      Pertinent Vitals/Pain Pain Assessment Pain Assessment: No/denies pain     Hand Dominance Right   Extremity/Trunk Assessment Upper Extremity Assessment Upper Extremity Assessment: RUE deficits/detail RUE Deficits / Details: impaired motor control and sensation secondary to block   Lower Extremity Assessment Lower Extremity Assessment: Overall WFL for tasks assessed   Cervical / Trunk Assessment Cervical / Trunk Assessment: Normal   Communication Communication Communication: No difficulties   Cognition Arousal/Alertness: Awake/alert Behavior During Therapy: WFL for tasks assessed/performed Overall Cognitive Status: Within Functional Limits for tasks assessed  General Comments       Exercises     Shoulder Instructions Shoulder Instructions Donning/doffing shirt without moving shoulder: Independent;Patient able to independently direct caregiver Method for sponge bathing under operated UE: Patient able to independently direct caregiver Donning/doffing sling/immobilizer: Patient able to independently direct caregiver Correct positioning of  sling/immobilizer: Independent ROM for elbow, wrist and digits of operated UE: Independent Sling wearing schedule (on at all times/off for ADL's): Independent Proper positioning of operated UE when showering: Independent Dressing change: Independent Positioning of UE while sleeping: Independent    Home Living Family/patient expects to be discharged to:: Private residence Living Arrangements: Spouse/significant other Available Help at Discharge: Available 24 hours/day                                    Prior Functioning/Environment Prior Level of Function : Independent/Modified Independent                        OT Problem List: Decreased strength;Decreased range of motion;Impaired UE functional use      OT Treatment/Interventions:      OT Goals(Current goals can be found in the care plan section) Acute Rehab OT Goals OT Goal Formulation: All assessment and education complete, DC therapy  OT Frequency:      Co-evaluation              AM-PAC OT "6 Clicks" Daily Activity     Outcome Measure Help from another person eating meals?: A Little Help from another person taking care of personal grooming?: None Help from another person toileting, which includes using toliet, bedpan, or urinal?: A Little Help from another person bathing (including washing, rinsing, drying)?: A Little Help from another person to put on and taking off regular upper body clothing?: A Lot Help from another person to put on and taking off regular lower body clothing?: A Little 6 Click Score: 18   End of Session Nurse Communication: Mobility status (OT education complete)  Activity Tolerance: Patient tolerated treatment well Patient left: in chair;with call bell/phone within reach;with chair alarm set  OT Visit Diagnosis: Pain                Time: 6384-5364 OT Time Calculation (min): 27 min Charges:  OT General Charges $OT Visit: 1 Visit OT Evaluation $OT Eval Low  Complexity: 1 Low OT Treatments $Self Care/Home Management : 8-22 mins  Domnick Chervenak, OTR/L Acute Care Rehab Services  Office [phone] Pager: [phone]   Kelli Churn 02/17/2022, 9:44 AM

## 2022-02-17 NOTE — Discharge Summary (Signed)
In most cases prophylactic antibiotics for Dental procdeures after total joint surgery are not necessary.  Exceptions are as follows:  1. History of prior total joint infection  2. Severely immunocompromised (Organ Transplant, cancer chemotherapy, Rheumatoid biologic meds such as Humera)  3. Poorly controlled diabetes (A1C &gt; 8.0, blood glucose over 200)  If you have one of these conditions, contact your surgeon for an antibiotic prescription, prior to your dental procedure. Orthopedic Discharge Summary        Physician Discharge Summary  Patient ID: Kevin Jones MRN: [id number] DOB/AGE: April 28, 1946 76 y.o.  Admit date: 02/16/2022 Discharge date: 02/17/2022   Procedures:  Procedure(s) (LRB): REVERSE SHOULDER ARTHROPLASTY (Right)  Attending Physician:  Dr. Malon Kindle  Admission Diagnoses:   right shoulder cuff arthropathy  Discharge Diagnoses:  right shoulder cuff arthropathy   Past Medical History:  Diagnosis Date   Arthritis    Diabetes mellitus without complication (HCC)    Family history of adverse reaction to anesthesia    Daughter has severe nausea   GERD (gastroesophageal reflux disease)    Headache    from sinus congestion   Hyperlipidemia    Hypertension     PCP: [name], MD   Discharged Condition: good  Hospital Course:  Patient underwent the above stated procedure on 02/16/2022. Patient tolerated the procedure well and brought to the recovery room in good condition and subsequently to the floor. Patient had an uncomplicated hospital course and was stable for discharge.   Disposition: Discharge disposition: 01-Home or Self Care      with follow up in 2 weeks    Follow-up Information     Beverely Low, MD. Go on 02/27/2022.   Specialty: Orthopedic Surgery Why: You are scheduled for first post op appointment on Tuesday August 8th at 9:30am. Contact information: [address] [place] [address] [place]  [address] [phone]         Beverely Low, MD. Call in 2 week(s).   Specialty: Orthopedic Surgery Why: call [phone] for appt Contact information: [address] [place] [address] [place] [address] [phone]                 Dental Antibiotics:  In most cases prophylactic antibiotics for Dental procdeures after total joint surgery are not necessary.  Exceptions are as follows:  1. History of prior total joint infection  2. Severely immunocompromised (Organ Transplant, cancer chemotherapy, Rheumatoid biologic meds such as Humera)  3. Poorly controlled diabetes (A1C &gt; 8.0, blood glucose over 200)  If you have one of these conditions, contact your surgeon for an antibiotic prescription, prior to your dental procedure.  Discharge Instructions     Call MD / Call 911   Complete by: As directed    If you experience chest pain or shortness of breath, CALL 911 and be transported to the hospital emergency room.  If you develope a fever above 101 F, pus (white drainage) or increased drainage or redness at the wound, or calf pain, call your surgeon's office.   Constipation Prevention   Complete by: As directed    Drink plenty of fluids.  Prune juice may be helpful.  You may use a stool softener, such as Colace (over the counter) 100 mg twice a day.  Use MiraLax (over the counter) for constipation as needed.   Diet - low sodium heart healthy   Complete by: As directed    Increase activity slowly as tolerated   Complete by: As directed  Post-operative opioid taper instructions:   Complete by: As directed    POST-OPERATIVE OPIOID TAPER INSTRUCTIONS: It is important to wean off of your opioid medication as soon as possible. If you do not need pain medication after your surgery it is ok to stop day one. Opioids include: Codeine, Hydrocodone(Norco, Vicodin), Oxycodone(Percocet, oxycontin) and hydromorphone amongst others.  Long term and even short term use of opiods  can cause: Increased pain response Dependence Constipation Depression Respiratory depression And more.  Withdrawal symptoms can include Flu like symptoms Nausea, vomiting And more Techniques to manage these symptoms Hydrate well Eat regular healthy meals Stay active Use relaxation techniques(deep breathing, meditating, yoga) Do Not substitute Alcohol to help with tapering If you have been on opioids for less than two weeks and do not have pain than it is ok to stop all together.  Plan to wean off of opioids This plan should start within one week post op of your joint replacement. Maintain the same interval or time between taking each dose and first decrease the dose.  Cut the total daily intake of opioids by one tablet each day Next start to increase the time between doses. The last dose that should be eliminated is the evening dose.          Allergies as of 02/17/2022       Reactions   Compazine [prochlorperazine Edisylate] Rash, Other (See Comments)   Muscle contractions   Sulfa Antibiotics Rash        Medication List     STOP taking these medications    HYDROcodone-acetaminophen 5-325 MG tablet Commonly known as: NORCO/VICODIN       TAKE these medications    acetaminophen 500 MG tablet Commonly known as: TYLENOL Take 1,000 mg by mouth every 8 (eight) hours as needed for moderate pain.   alfuzosin 10 MG 24 hr tablet Commonly known as: UROXATRAL Take 10 mg by mouth every evening.   allopurinol 300 MG tablet Commonly known as: ZYLOPRIM Take 150 mg by mouth at bedtime.   amLODipine 10 MG tablet Commonly known as: NORVASC Take 10 mg by mouth daily.   aspirin EC 81 MG tablet Take 81 mg by mouth daily.   atenolol 50 MG tablet Commonly known as: TENORMIN Take 50 mg by mouth 2 (two) times daily.   atorvastatin 10 MG tablet Commonly known as: LIPITOR Take 10 mg by mouth every evening.   famotidine 20 MG tablet Commonly known as: PEPCID Take 20  mg by mouth every evening.   fluticasone 50 MCG/ACT nasal spray Commonly known as: FLONASE USE 1 SPRAY IN EACH NOSTRIL DAILY What changed:  when to take this reasons to take this   loratadine 10 MG tablet Commonly known as: CLARITIN TAKE 1 TABLET DAILY (NEED APPOINTMENT FOR FURTHER REFILLS) What changed: See the new instructions.   losartan 50 MG tablet Commonly known as: COZAAR Take 50 mg by mouth daily.   metFORMIN 500 MG tablet Commonly known as: GLUCOPHAGE Take 500 mg by mouth 2 (two) times daily with a meal.   ondansetron 4 MG tablet Commonly known as: Zofran Take 1 tablet (4 mg total) by mouth every 8 (eight) hours as needed for nausea, vomiting or refractory nausea / vomiting.   oxyCODONE-acetaminophen 7.5-325 MG tablet Commonly known as: Percocet Take 1 tablet by mouth every 4 (four) hours as needed for severe pain.   pantoprazole 40 MG tablet Commonly known as: PROTONIX Take 40 mg by mouth daily.   tiZANidine 4 MG  tablet Commonly known as: ZANAFLEX Take 1 tablet (4 mg total) by mouth every 8 (eight) hours as needed for muscle spasms.          Signed: Thea Gist 02/17/2022, 8:35 AM  [facility] is now [facility] [address], [address], [place], [place] [address] Phone: [phone] Facebook  Instagram  [employer]

## 2022-02-19 ENCOUNTER — Encounter (HOSPITAL_COMMUNITY): Payer: Self-pay | Admitting: Orthopedic Surgery

## 2023-01-14 ENCOUNTER — Other Ambulatory Visit: Payer: Self-pay | Admitting: Pulmonary Disease

## 2023-01-14 DIAGNOSIS — J310 Chronic rhinitis: Secondary | ICD-10-CM

## 2023-01-14 DIAGNOSIS — R059 Cough, unspecified: Secondary | ICD-10-CM

## 2023-01-28 NOTE — Progress Notes (Signed)
Cardiology Office Note:    Date:  02/01/2023   ID:  Kevin Jones, DOB 25-Dec-1945, MRN [id number]  PCP:  [name], MD   [facility] Providers Cardiologist:  Alverda Skeans, MD Referring MD: [name], MD   Chief Complaint/Reason for Referral: Hypertension  ASSESSMENT:    1. Type 2 diabetes mellitus with complication, without long-term current use of insulin (HCC)   2. Hypertension associated with diabetes (HCC)   3. Hyperlipidemia associated with type 2 diabetes mellitus (HCC)   4. BMI 40.0-44.9, adult (HCC)     PLAN:    In order of problems listed above: 1.  Type 2 diabetes: Continue aspirin, losartan, atorvastatin, and start Jardiance.  Continue Mounjaro  2.  Hypertension: Blood pressure goal is less than 130/80 given his history of diabetes.  Will start chlorthalidone 25 mg daily; check labs next week. 3.  Hyperlipidemia: Check lipid panel, LFTs, and LP(a) next week. 4.  Elevated BMI: Patient is on Mounjaro.  Consider evaluation for sleep apnea in the future however he is losing weight.       Dispo:  Return in about 6 months (around 08/04/2023).      Medication Adjustments/Labs and Tests Ordered: Current medicines are reviewed at length with the patient today.  Concerns regarding medicines are outlined above.  The following changes have been made:     Labs/tests ordered: Orders Placed This Encounter  Procedures   Comprehensive metabolic panel   Lipid panel   Lipoprotein A (LPA)   EKG 12-Lead    Medication Changes: Meds ordered this encounter  Medications   empagliflozin (JARDIANCE) 10 MG TABS tablet    Sig: Take 1 tablet (10 mg total) by mouth daily before breakfast.    Dispense:  28 tablet    Refill:  0   empagliflozin (JARDIANCE) 10 MG TABS tablet    Sig: Take 1 tablet (10 mg total) by mouth daily before breakfast.    Dispense:  90 tablet    Refill:  3   chlorthalidone (HYGROTON) 25 MG tablet    Sig: Take 1 tablet (25 mg total) by mouth  daily.    Dispense:  90 tablet    Refill:  3    Current medicines are reviewed at length with the patient today.  The patient does not have concerns regarding medicines.  History of Present Illness:    FOCUSED PROBLEM LIST:   1.  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  Hypertension 3.  Hyperlipidemia 4.  BMI of 41 5.  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 male with the indicated medical history here for recommendations regarding hypertension.  On review of the patient's chart he had undergone orthopedic surgery in July of last year without any perioperative cardiovascular issues.  The patient was seen in his PCPs office in May.  He has had issues with difficult to control blood pressure.  He was actually started on prazosin which resulted in a low blood pressure.  This was stopped and on 2-week follow-up his blood pressure was noted to be 154/75 on a regimen including atenolol 100 mg daily and amlodipine 10 mg daily as well as losartan 100 mg daily.  The patient is doing well.  He denies any angina, dyspnea, severe bleeding or bruising, paroxysmal nocturnal dyspnea, orthopnea.  He is exercising every day on a stationary bike without any limitations.  He did bring his blood pressure log and  and his pressures have been generally been in the 130s to 150s.  He was started on Mounjaro not too long ago and is lost about 8 pounds.  He does snore but he denies any daytime somnolence.  He is required no emergency room visits or hospitalizations for cardiovascular issues.       Previous Medical History: Past Medical History:  Diagnosis Date   Arthritis    Diabetes mellitus without complication (HCC)    Family history of adverse reaction to anesthesia    Daughter has severe nausea   GERD (gastroesophageal reflux disease)    Headache    from sinus congestion   Hyperlipidemia    Hypertension      Current  Medications: Current Meds  Medication Sig   chlorthalidone (HYGROTON) 25 MG tablet Take 1 tablet (25 mg total) by mouth daily.   empagliflozin (JARDIANCE) 10 MG TABS tablet Take 1 tablet (10 mg total) by mouth daily before breakfast.   empagliflozin (JARDIANCE) 10 MG TABS tablet Take 1 tablet (10 mg total) by mouth daily before breakfast.   tirzepatide (MOUNJARO) 5 MG/0.5ML Pen Inject 5 mg into the skin once a week.     Allergies:    Compazine [prochlorperazine edisylate] and Sulfa antibiotics   Social History:   Social History   Tobacco Use   Smoking status: Never   Smokeless tobacco: Never  Vaping Use   Vaping status: Never Used  Substance Use Topics   Alcohol use: Yes    Alcohol/week: 2.0 standard drinks of alcohol    Types: 2 Shots of liquor per week    Comment: 2-3 x per week  a glass of wine   Drug use: No     Family Hx: Family History  Problem Relation Age of Onset   Cirrhosis Sister    Lung cancer Sister      Review of Systems:   Please see the history of present illness.    All other systems reviewed and are negative.     EKGs/Labs/Other Test Reviewed:    EKG:    EKG Interpretation Date/Time:  Friday February 01 2023 08:05:56 EDT Ventricular Rate:  68 PR Interval:  258 QRS Duration:  138 QT Interval:  396 QTC Calculation: 421 R Axis:   -35  Text Interpretation: Sinus rhythm with 1st degree A-V block Left axis deviation Right bundle branch block When compared with ECG of 06-Feb-2022 08:28, QRS axis Shifted left Confirmed by Alverda Skeans ([id number]) on 02/01/2023 8:07:10 AM         Prior CV studies reviewed:  Cardiac Studies & Procedures     STRESS TESTS  NM MYOCAR MULTI W/SPECT W 01/15/2016  Narrative CLINICAL DATA:  Chest pain, hypertension, hyperlipidemia and chronic kidney disease.  EXAM: MYOCARDIAL IMAGING WITH SPECT (REST AND PHARMACOLOGIC-STRESS)  GATED LEFT VENTRICULAR WALL MOTION STUDY  LEFT VENTRICULAR EJECTION  FRACTION  TECHNIQUE: Standard myocardial SPECT imaging was performed after resting intravenous injection of 10 mCi Tc-54m tetrofosmin. Subsequently, intravenous infusion of Lexiscan was performed under the supervision of the Cardiology staff. At peak effect of the drug, 30 mCi Tc-32m tetrofosmin was injected intravenously and standard myocardial SPECT imaging was performed. Quantitative gated imaging was also performed to evaluate left ventricular wall motion, and estimate left ventricular ejection fraction.  COMPARISON:  None.  FINDINGS: Perfusion: No evidence of inducible myocardial ischemia. Mild attenuation of the inferior wall on both stress and rest acquisitions is felt to most likely represent diaphragmatic attenuation.  Wall Motion: Normal left ventricular  wall motion. No left ventricular dilation.  Left Ventricular Ejection Fraction: 67 %  End diastolic volume 101 ml  End systolic volume 34 ml  IMPRESSION: 1. No evidence of myocardial ischemia. Mild inferior wall attenuation is felt to represent diaphragmatic attenuation.  2. Normal left ventricular wall motion.  3. Left ventricular ejection fraction 67%  4. Non invasive risk stratification*: Low  *2012 Appropriate Use Criteria for Coronary Revascularization Focused Update: J Am Coll Cardiol. 2012;59(9):857-881. http://content.dementiazones.com.aspx?articleid=1201161   Electronically Signed By: Irish Lack M.D. On: 01/15/2016 13:46              Other studies Reviewed: No imaging studies demonstrating aortic atherosclerosis or coronary artery calcification  Recent Labs: 02/06/2022: Platelets 171 02/17/2022: BUN 17; Creatinine, Ser 0.92; Hemoglobin 13.2; Potassium 4.1; Sodium 138   Lipid Panel No results found for: "CHOL", "TRIG", "HDL", "CHOLHDL", "VLDL", "LDLCALC", "LDLDIRECT"  Risk Assessment/Calculations:                Physical Exam:    VS:  BP 134/78   Pulse 74   Ht 5\' 9"   (1.753 m)   Wt 255 lb 3.2 oz (115.8 kg)   SpO2 94%   BMI 37.69 kg/m    Wt Readings from Last 3 Encounters:  02/01/23 255 lb 3.2 oz (115.8 kg)  02/16/22 265 lb 10.5 oz (120.5 kg)  02/06/22 265 lb 12.8 oz (120.6 kg)         GENERAL:  No apparent distress, AOx3 HEENT:  No carotid bruits, +2 carotid impulses, no scleral icterus CAR: RRR no murmurs, gallops, rubs, or thrills RES:  Clear to auscultation bilaterally ABD:  Soft, nontender, nondistended, positive bowel sounds x 4 VASC:  +2 radial pulses, +2 carotid pulses, palpable pedal pulses NEURO:  CN 2-12 grossly intact; motor and sensory grossly intact PSYCH:  No active depression or anxiety EXT:  No edema, ecchymosis, or cyanosis  Signed, Orbie Pyo, MD  02/01/2023 8:36 AM    [facility] [address] [place], [place], [place]  [address] Phone: [phone]; Fax: [phone]   Note:  This document was prepared using Dragon voice recognition software and may include unintentional dictation errors.

## 2023-02-01 ENCOUNTER — Ambulatory Visit: Payer: Medicare Other | Attending: Internal Medicine | Admitting: Internal Medicine

## 2023-02-01 ENCOUNTER — Encounter: Payer: Self-pay | Admitting: Internal Medicine

## 2023-02-01 VITALS — BP 134/78 | HR 74 | Ht 69.0 in | Wt 255.2 lb

## 2023-02-01 DIAGNOSIS — E1169 Type 2 diabetes mellitus with other specified complication: Secondary | ICD-10-CM | POA: Diagnosis not present

## 2023-02-01 DIAGNOSIS — I152 Hypertension secondary to endocrine disorders: Secondary | ICD-10-CM

## 2023-02-01 DIAGNOSIS — E118 Type 2 diabetes mellitus with unspecified complications: Secondary | ICD-10-CM | POA: Diagnosis not present

## 2023-02-01 DIAGNOSIS — Z6841 Body Mass Index (BMI) 40.0 and over, adult: Secondary | ICD-10-CM | POA: Diagnosis not present

## 2023-02-01 DIAGNOSIS — E1159 Type 2 diabetes mellitus with other circulatory complications: Secondary | ICD-10-CM

## 2023-02-01 DIAGNOSIS — Z7985 Long-term (current) use of injectable non-insulin antidiabetic drugs: Secondary | ICD-10-CM

## 2023-02-01 DIAGNOSIS — E785 Hyperlipidemia, unspecified: Secondary | ICD-10-CM

## 2023-02-01 MED ORDER — EMPAGLIFLOZIN 10 MG PO TABS: 10.0000 mg | ORAL_TABLET | Freq: Every day | ORAL | 3 refills | Status: DC

## 2023-02-01 MED ORDER — EMPAGLIFLOZIN 10 MG PO TABS: 10.0000 mg | ORAL_TABLET | Freq: Every day | ORAL | 0 refills | Status: DC

## 2023-02-01 MED ORDER — CHLORTHALIDONE 25 MG PO TABS: 25.0000 mg | ORAL_TABLET | Freq: Every day | ORAL | 3 refills | Status: DC

## 2023-02-01 NOTE — Patient Instructions (Signed)
Medication Instructions:  Your physician has recommended you make the following change in your medication:   Start Jardiance 10 mg daily Start chlorthalidone 25 mg daily  *If you need a refill on your cardiac medications before your next appointment, please call your pharmacy*   Lab Work: CMET, Lpa lipids 1 week If you have labs (blood work) drawn today and your tests are completely normal, you will receive your results only by: MyChart Message (if you have MyChart) OR A paper copy in the mail If you have any lab test that is abnormal or we need to change your treatment, we will call you to review the results.    Follow-Up: At [facility], you and your health needs are our priority.  As part of our continuing mission to provide you with exceptional heart care, we have created designated Provider Care Teams.  These Care Teams include your primary Cardiologist (physician) and Advanced Practice Providers (APPs -  Physician Assistants and Nurse Practitioners) who all work together to provide you with the care you need, when you need it.   Your next appointment:   6 month(s)  Provider:

## 2023-02-01 NOTE — Addendum Note (Signed)
Addended by: Alvin Critchley A on: 02/01/2023 11:17 AM   Modules accepted: Orders

## 2023-02-04 ENCOUNTER — Encounter: Payer: Self-pay | Admitting: Internal Medicine

## 2023-02-04 NOTE — Telephone Encounter (Signed)
Gus Height, MD  You21 minutes ago (12:59 PM)   AT I would tell him to keep well-hydrated for now.  If we need to we can decrease the dose of chlorthalidone in half.

## 2023-02-11 ENCOUNTER — Ambulatory Visit: Payer: Medicare Other | Attending: Internal Medicine

## 2023-02-11 DIAGNOSIS — E118 Type 2 diabetes mellitus with unspecified complications: Secondary | ICD-10-CM

## 2023-02-11 DIAGNOSIS — Z6841 Body Mass Index (BMI) 40.0 and over, adult: Secondary | ICD-10-CM

## 2023-02-11 DIAGNOSIS — I152 Hypertension secondary to endocrine disorders: Secondary | ICD-10-CM

## 2023-02-11 DIAGNOSIS — E1169 Type 2 diabetes mellitus with other specified complication: Secondary | ICD-10-CM

## 2023-02-12 LAB — LIPID PANEL
Chol/HDL Ratio: 3.3 ratio (ref 0.0–5.0)
Cholesterol, Total: 101 mg/dL (ref 100–199)
LDL Chol Calc (NIH): 54 mg/dL (ref 0–99)
VLDL Cholesterol Cal: 16 mg/dL (ref 5–40)

## 2023-02-13 ENCOUNTER — Telehealth: Payer: Self-pay | Admitting: Internal Medicine

## 2023-02-13 DIAGNOSIS — R7989 Other specified abnormal findings of blood chemistry: Secondary | ICD-10-CM

## 2023-02-13 DIAGNOSIS — Z79899 Other long term (current) drug therapy: Secondary | ICD-10-CM

## 2023-02-13 LAB — COMPREHENSIVE METABOLIC PANEL WITH GFR
ALT: 22 IU/L (ref 0–44)
AST: 20 IU/L (ref 0–40)
Albumin: 3.8 g/dL (ref 3.8–4.8)
Alkaline Phosphatase: 110 IU/L (ref 44–121)
BUN/Creatinine Ratio: 33 — ABNORMAL HIGH (ref 10–24)
BUN: 48 mg/dL — ABNORMAL HIGH (ref 8–27)
Bilirubin Total: 0.4 mg/dL (ref 0.0–1.2)
CO2: 23 mmol/L (ref 20–29)
Calcium: 8.9 mg/dL (ref 8.6–10.2)
Chloride: 96 mmol/L (ref 96–106)
Creatinine, Ser: 1.47 mg/dL — ABNORMAL HIGH (ref 0.76–1.27)
Globulin, Total: 2.6 g/dL (ref 1.5–4.5)
Glucose: 93 mg/dL (ref 70–99)
Potassium: 3.9 mmol/L (ref 3.5–5.2)
Sodium: 136 mmol/L (ref 134–144)
Total Protein: 6.4 g/dL (ref 6.0–8.5)
eGFR: 49 mL/min/1.73 — ABNORMAL LOW

## 2023-02-13 LAB — LIPOPROTEIN A (LPA): Lipoprotein (a): 36.9 nmol/L

## 2023-02-13 LAB — LIPID PANEL
HDL: 31 mg/dL — ABNORMAL LOW (ref >39)
Triglycerides: 78 mg/dL (ref 0–149)

## 2023-02-13 NOTE — Telephone Encounter (Signed)
Called patient to let him know Dr. Lynnette Caffey wants him to get lab work in one week. Will order and schedule patient.

## 2023-02-13 NOTE — Telephone Encounter (Signed)
Pt calling to schedule repeat BMP labs per provider. Order has yet to be put in. Pt would like a callback once order has been put in so he can schedule. Please advise

## 2023-02-13 NOTE — Telephone Encounter (Signed)
-----   Message from Orbie Pyo sent at 02/13/2023  7:52 AM EDT ----- Please repeat BMP in 1 week.

## 2023-02-20 ENCOUNTER — Telehealth: Payer: Self-pay | Admitting: Internal Medicine

## 2023-02-20 ENCOUNTER — Ambulatory Visit: Payer: Medicare Other | Attending: Internal Medicine

## 2023-02-20 DIAGNOSIS — R7989 Other specified abnormal findings of blood chemistry: Secondary | ICD-10-CM

## 2023-02-20 DIAGNOSIS — Z79899 Other long term (current) drug therapy: Secondary | ICD-10-CM

## 2023-02-20 NOTE — Telephone Encounter (Signed)
I will put in providers box today. Thank you.

## 2023-02-20 NOTE — Telephone Encounter (Signed)
Patient dropped off BP reading for Thukkani.

## 2023-02-20 NOTE — Telephone Encounter (Signed)
No documents in proveider box at this time.

## 2023-02-21 ENCOUNTER — Other Ambulatory Visit: Payer: Self-pay | Admitting: *Deleted

## 2023-02-21 ENCOUNTER — Other Ambulatory Visit: Payer: Self-pay

## 2023-02-21 DIAGNOSIS — Z79899 Other long term (current) drug therapy: Secondary | ICD-10-CM

## 2023-02-21 DIAGNOSIS — I10 Essential (primary) hypertension: Secondary | ICD-10-CM

## 2023-02-27 ENCOUNTER — Encounter: Payer: Self-pay | Admitting: Internal Medicine

## 2023-02-28 ENCOUNTER — Ambulatory Visit: Payer: Medicare Other | Attending: Internal Medicine

## 2023-02-28 DIAGNOSIS — I10 Essential (primary) hypertension: Secondary | ICD-10-CM

## 2023-02-28 DIAGNOSIS — Z79899 Other long term (current) drug therapy: Secondary | ICD-10-CM

## 2023-02-28 LAB — BASIC METABOLIC PANEL
BUN/Creatinine Ratio: 34 — ABNORMAL HIGH (ref 10–24)
BUN: 46 mg/dL — ABNORMAL HIGH (ref 8–27)
CO2: 25 mmol/L (ref 20–29)
Calcium: 8.9 mg/dL (ref 8.6–10.2)
Chloride: 93 mmol/L — ABNORMAL LOW (ref 96–106)
Creatinine, Ser: 1.36 mg/dL — ABNORMAL HIGH (ref 0.76–1.27)
Glucose: 96 mg/dL (ref 70–99)
Potassium: 4.1 mmol/L (ref 3.5–5.2)
Sodium: 132 mmol/L — ABNORMAL LOW (ref 134–144)
eGFR: 54 mL/min/{1.73_m2} — ABNORMAL LOW (ref >59)

## 2023-03-01 ENCOUNTER — Telehealth: Payer: Self-pay

## 2023-03-01 NOTE — Telephone Encounter (Signed)
-----   Message from Orbie Pyo sent at 02/28/2023  4:53 PM EDT ----- BMP in 2 weeks

## 2023-03-01 NOTE — Telephone Encounter (Signed)
Patient aware of lab results. He will call to schedule lab visit

## 2023-03-15 ENCOUNTER — Ambulatory Visit: Payer: Medicare Other | Attending: Internal Medicine

## 2023-03-15 ENCOUNTER — Other Ambulatory Visit: Payer: Self-pay

## 2023-03-15 DIAGNOSIS — Z79899 Other long term (current) drug therapy: Secondary | ICD-10-CM

## 2023-03-15 DIAGNOSIS — E1159 Type 2 diabetes mellitus with other circulatory complications: Secondary | ICD-10-CM

## 2023-03-15 DIAGNOSIS — I10 Essential (primary) hypertension: Secondary | ICD-10-CM

## 2023-03-16 LAB — BASIC METABOLIC PANEL
BUN/Creatinine Ratio: 36 — ABNORMAL HIGH (ref 10–24)
BUN: 48 mg/dL — ABNORMAL HIGH (ref 8–27)
CO2: 24 mmol/L (ref 20–29)
Calcium: 9.1 mg/dL (ref 8.6–10.2)
Chloride: 94 mmol/L — ABNORMAL LOW (ref 96–106)
Creatinine, Ser: 1.33 mg/dL — ABNORMAL HIGH (ref 0.76–1.27)
Glucose: 96 mg/dL (ref 70–99)
Potassium: 4.1 mmol/L (ref 3.5–5.2)
Sodium: 134 mmol/L (ref 134–144)
eGFR: 55 mL/min/{1.73_m2} — ABNORMAL LOW (ref >59)

## 2023-04-12 ENCOUNTER — Other Ambulatory Visit: Payer: Self-pay | Admitting: Pulmonary Disease

## 2023-04-12 DIAGNOSIS — R059 Cough, unspecified: Secondary | ICD-10-CM

## 2023-04-12 DIAGNOSIS — J310 Chronic rhinitis: Secondary | ICD-10-CM

## 2023-04-24 LAB — LAB REPORT - SCANNED
A1c: 5.4
Creatinine, POC: 104 mg/dL
EGFR: 56
Microalb Creat Ratio: 20.9
Microalbumin, Urine: 2.17

## 2023-04-26 ENCOUNTER — Encounter: Payer: Self-pay | Admitting: Internal Medicine

## 2023-04-26 DIAGNOSIS — I10 Essential (primary) hypertension: Secondary | ICD-10-CM

## 2023-04-29 NOTE — Telephone Encounter (Signed)
Per the discussion between Dr. Lynnette Caffey and patient via MyChart, patient is to stop chlorthalidone and check bmet in 2 weeks.  Updated med list and asked patient to make lab appointment.

## 2023-04-29 NOTE — Addendum Note (Signed)
Addended by: Lendon Ka on: 04/29/2023 05:01 PM   Modules accepted: Orders

## 2023-05-09 ENCOUNTER — Ambulatory Visit: Payer: PRIVATE HEALTH INSURANCE

## 2023-05-13 ENCOUNTER — Telehealth: Payer: Self-pay | Admitting: Internal Medicine

## 2023-05-13 ENCOUNTER — Ambulatory Visit: Payer: Medicare Other | Attending: Internal Medicine

## 2023-05-13 DIAGNOSIS — I10 Essential (primary) hypertension: Secondary | ICD-10-CM

## 2023-05-13 NOTE — Telephone Encounter (Signed)
Pt dropped of bp readings. Will be in providers box by eod.

## 2023-05-14 LAB — BASIC METABOLIC PANEL
BUN/Creatinine Ratio: 31 — ABNORMAL HIGH (ref 10–24)
BUN: 30 mg/dL — ABNORMAL HIGH (ref 8–27)
CO2: 27 mmol/L (ref 20–29)
Calcium: 9 mg/dL (ref 8.6–10.2)
Chloride: 103 mmol/L (ref 96–106)
Creatinine, Ser: 0.98 mg/dL (ref 0.76–1.27)
Glucose: 93 mg/dL (ref 70–99)
Potassium: 4.6 mmol/L (ref 3.5–5.2)
Sodium: 143 mmol/L (ref 134–144)
eGFR: 80 mL/min/{1.73_m2} (ref >59)

## 2023-05-22 ENCOUNTER — Encounter: Payer: Self-pay | Admitting: Internal Medicine

## 2023-05-23 NOTE — Telephone Encounter (Signed)
BP readings provided to Dr. Lynnette Caffey.  No new recommendations.

## 2023-07-21 ENCOUNTER — Other Ambulatory Visit: Payer: Self-pay | Admitting: Internal Medicine

## 2023-07-22 ENCOUNTER — Other Ambulatory Visit: Payer: Self-pay | Admitting: Internal Medicine

## 2023-07-25 ENCOUNTER — Telehealth: Payer: Self-pay | Admitting: Internal Medicine

## 2023-07-25 ENCOUNTER — Other Ambulatory Visit (HOSPITAL_COMMUNITY): Payer: Self-pay

## 2023-07-25 ENCOUNTER — Telehealth: Payer: Self-pay | Admitting: Pharmacy Technician

## 2023-07-25 DIAGNOSIS — E118 Type 2 diabetes mellitus with unspecified complications: Secondary | ICD-10-CM

## 2023-07-25 MED ORDER — EMPAGLIFLOZIN 10 MG PO TABS: 10.0000 mg | ORAL_TABLET | Freq: Every day | ORAL | 3 refills | Status: DC

## 2023-07-25 MED ORDER — EMPAGLIFLOZIN 10 MG PO TABS: 10.0000 mg | ORAL_TABLET | Freq: Every day | ORAL | 1 refills | Status: DC

## 2023-07-25 NOTE — Telephone Encounter (Signed)
 Pt c/o medication issue:  1. Name of Medication:  empagliflozin  (JARDIANCE ) 10 MG TABS tablet  2. How are you currently taking this medication (dosage and times per day)?   3. Are you having a reaction (difficulty breathing--STAT)?   4. What is your medication issue?   Patient states his insurance needs a PA for Jardiance .

## 2023-07-25 NOTE — Addendum Note (Signed)
 Addended by: Cheree Ditto on: 07/25/2023 10:12 AM   Modules accepted: Orders

## 2023-07-25 NOTE — Telephone Encounter (Signed)
 Called and spoke with patient to clarify if using jardiance  or Farxiga since med list shows both, but jardiance  inactive. Pt states he was going to get switched, but didn't want to change medications since he was doing so well, so he never did. Requests that we update records to show jardiance  and no farxiga. Done at this time. 90 day supply sent to [facility] per request.

## 2023-07-25 NOTE — Telephone Encounter (Signed)
 Pharmacy Patient Advocate Encounter   Received notification from Pt Calls Messages that prior authorization for Jardiance  is required/requested.   Insurance verification completed.   The patient is insured through  [facility]  .   Per test claim: The current 07/25/23 day co-pay is, $0.00 if ran for 90 days.  No PA needed at this time. This test claim was processed through [facility]- copay amounts may vary at other pharmacies due to pharmacy/plan contracts, or as the patient moves through the different stages of their insurance plan.

## 2023-07-25 NOTE — Telephone Encounter (Signed)
 Pt's medication was sent to pt's pharmacy as requested. Confirmation received.

## 2023-07-25 NOTE — Telephone Encounter (Signed)
*  STAT* If patient is at the pharmacy, call can be transferred to refill team.   1. Which medications need to be refilled? (please list name of each medication and dose if known) empagliflozin  (JARDIANCE ) 10 MG TABS tablet  2. Which pharmacy/location (including street and city if local pharmacy) is medication to be sent to? [id number] [place], [place] - [address]  3. Do they need a 30 day or 90 day supply?   90 day supply

## 2023-08-12 ENCOUNTER — Ambulatory Visit: Payer: Medicare Other | Admitting: Pulmonary Disease

## 2023-08-12 ENCOUNTER — Encounter: Payer: Self-pay | Admitting: Pulmonary Disease

## 2023-08-12 VITALS — BP 129/79 | HR 60 | Temp 97.7°F | Ht 68.0 in | Wt 231.8 lb

## 2023-08-12 DIAGNOSIS — R053 Chronic cough: Secondary | ICD-10-CM | POA: Diagnosis not present

## 2023-08-12 MED ORDER — HYDROCOD POLI-CHLORPHE POLI ER 10-8 MG/5ML PO SUER: 5.0000 mL | Freq: Every evening | ORAL | 0 refills | Status: AC | PRN

## 2023-08-12 NOTE — Progress Notes (Signed)
@Patient  ID: Kevin Jones, male    DOB: [birth year], 78 y.o.   MRN: [id number]  Chief Complaint  Patient presents with   Follow-up    Referring provider: [name], MD  HPI:   78 y.o. whom we are seeing at the request of PCP for evaluation of chronic cough.  PCP notes reviewed.  Last seen 35 months ago.  Cough well-controlled.  Larger by GERD improved with PPI and H2 blocker.  Still has some residual near daily cough.  On review of systems seen mostly due to rhinitis, postnasal drip.  Escalated antihistamines and intranasal steroids.  Cough essentially gone.  Has not been an issue for a long time.  He gets 1-2 episodes a year of a coughing fit.  It is treated and resolved quickly with hydrocodone cough syrup.  No clear trigger.  He denies fever sore throat viral stuff.  No worsening rhinorrhea or runny nose, watery eyes, scratchy throat suggestive of allergic trigger.  He was sent here back via PCP given these issues.  HPI at initial visit: Patient had onset of cough about 1 year ago.  It is largely nonproductive.  Occasionally brings up white mucus but this is relatively rare.  Seems worse in the morning after waking up.  Has severe coughing fits at times that led to blown blood vessels in the eye, posttussive emesis.  He has been treated by his primary care doctor.  His PPI was changed to to a different formulation and Imodium was added at night.  He thinks the severity and frequency of cough is about 90% better.  However he still has a daily with severe episodes.  No associated dyspnea on exertion.  He uses Nettie pot for the first time a very long time last night and cough is better this morning.  He does endorse postnasal drip or sensation of mucus dripping the back of his throat.  No clear rhinitis anteriorly.  In the past had worse seasonal allergy symptoms but seems better.  Had been getting about yearly sinus infections but was put on loratadine and has not had any in 2 years.  He was  told to stop loratadine about 78 years ago.  He had deviated septum surgery about 10 years ago.  Prior to that he was using Nettie pot for seasonal allergies.  He has not used it since then at the direction of his ENT but again resumed recently as above.  Chest x-ray 10/2019 in the setting of symptoms reviewed and interpreted as clear lungs, no infiltrate, no effusion, no pneumothorax.  PMH: Diabetes hypertension, hyperlipidemia Surgical history: Sinus surgery 2010 Family history: No respiratory illnesses or issues in first-degree relatives overview Social history: Never smoker, lives with wife, has children  ACT:      No data to display          MMRC:     No data to display          Epworth:      No data to display          Tests:   FENO:  No results found for: "NITRICOXIDE"  PFT:     No data to display          WALK:      No data to display          Imaging: Personally reviewed interpreted as above  Lab Results:  Personally reviewed, notably eosinophils 500 in 2017 CBC    Component Value Date/Time  WBC 5.3 02/06/2022 0826   RBC 4.60 02/06/2022 0826   HGB 13.2 02/17/2022 0332   HCT 40.6 02/17/2022 0332   PLT 171 02/06/2022 0826   MCV 92.0 02/06/2022 0826   MCH 30.2 02/06/2022 0826   MCHC 32.9 02/06/2022 0826   RDW 13.6 02/06/2022 0826   LYMPHSABS 1.9 01/14/2016 2153   MONOABS 0.9 01/14/2016 2153   EOSABS 0.5 01/14/2016 2153   BASOSABS 0.0 01/14/2016 2153    BMET    Component Value Date/Time   NA 143 05/13/2023 0807   K 4.6 05/13/2023 0807   CL 103 05/13/2023 0807   CO2 27 05/13/2023 0807   GLUCOSE 93 05/13/2023 0807   GLUCOSE 99 02/17/2022 0332   BUN 30 (H) 05/13/2023 0807   CREATININE 0.98 05/13/2023 0807   CALCIUM 9.0 05/13/2023 0807   GFRNONAA >60 02/17/2022 0332   GFRAA >60 11/01/2016 0845    BNP    Component Value Date/Time   BNP 17.7 01/15/2016 0001    ProBNP No results found for: "PROBNP"  Specialty Problems        Pulmonary Problems   Nasal turbinate hypertrophy   Rhinitis, chronic   Snoring   Deviated septum    Allergies  Allergen Reactions   Compazine [Prochlorperazine Edisylate] Rash and Other (See Comments)    Muscle contractions   Chlorthalidone Other (See Comments)    Muscle spasms   Sulfa Antibiotics Rash   Terazosin Rash    Immunization History  Administered Date(s) Administered   Fluad Trivalent(High Dose 65+) 05/24/2023    Past Medical History:  Diagnosis Date   Arthritis    Diabetes mellitus without complication (HCC)    Family history of adverse reaction to anesthesia    Daughter has severe nausea   GERD (gastroesophageal reflux disease)    Headache    from sinus congestion   Hyperlipidemia    Hypertension     Tobacco History: Social History   Tobacco Use  Smoking Status Never  Smokeless Tobacco Never   Counseling given: Not Answered   Continue to not smoke  Outpatient Encounter Medications as of 08/12/2023  Medication Sig   acetaminophen (TYLENOL) 500 MG tablet Take 1,000 mg by mouth every 8 (eight) hours as needed for moderate pain.   alfuzosin (UROXATRAL) 10 MG 24 hr tablet Take 10 mg by mouth every evening.   allopurinol (ZYLOPRIM) 300 MG tablet Take 150 mg by mouth at bedtime.   amLODipine (NORVASC) 10 MG tablet Take 10 mg by mouth daily.   aspirin EC 81 MG tablet Take 81 mg by mouth daily.   atenolol (TENORMIN) 50 MG tablet Take 50 mg by mouth 2 (two) times daily.   atorvastatin (LIPITOR) 10 MG tablet Take 10 mg by mouth every evening.   chlorpheniramine-HYDROcodone (TUSSIONEX) 10-8 MG/5ML Take 5 mLs by mouth at bedtime as needed for cough.   empagliflozin (JARDIANCE) 10 MG TABS tablet Take 1 tablet (10 mg total) by mouth daily before breakfast.   famotidine (PEPCID) 20 MG tablet Take 20 mg by mouth every evening.   fluticasone (FLONASE) 50 MCG/ACT nasal spray USE 1 SPRAY IN EACH NOSTRIL DAILY   loratadine (CLARITIN) 10 MG tablet TAKE 1  TABLET DAILY (NEED APPOINTMENT FOR FURTHER REFILLS) (Patient taking differently: Take 10 mg by mouth daily as needed for allergies.)   losartan (COZAAR) 50 MG tablet Take 50 mg by mouth daily.   pantoprazole (PROTONIX) 40 MG tablet Take 40 mg by mouth daily.   tirzepatide [facility]) 5 MG/0.5ML  Pen Inject 5 mg into the skin once a week.   No facility-administered encounter medications on file as of 08/12/2023.     Review of Systems  Review of Systems  N/a  Physical Exam  BP 129/79 (BP Location: Left Arm, Patient Position: Sitting, Cuff Size: Large)   Pulse 60   Temp 97.7 F (36.5 C) (Oral)   Ht 5\' 8"  (1.727 m)   Wt 231 lb 12.8 oz (105.1 kg)   SpO2 96%   BMI 35.25 kg/m   Wt Readings from Last 5 Encounters:  08/12/23 231 lb 12.8 oz (105.1 kg)  02/01/23 255 lb 3.2 oz (115.8 kg)  02/16/22 265 lb 10.5 oz (120.5 kg)  02/06/22 265 lb 12.8 oz (120.6 kg)  09/09/20 260 lb 9.6 oz (118.2 kg)    BMI Readings from Last 5 Encounters:  08/12/23 35.25 kg/m  02/01/23 37.69 kg/m  02/16/22 38.67 kg/m  02/06/22 38.69 kg/m  09/09/20 38.48 kg/m     Physical Exam General obese, in no acute distress Eyes: EOMI, no icterus Neck supple, no JVP Cardiovascular: Regular rhythm, no murmurs Pulmonary: Clear to auscultation bilaterally, no wheeze Abdomen: Nondistended, bowel sounds present MSK: No synovitis, no joint effusion Neuro: Normal gait, no weakness Psych: No mood, full affect   Assessment & Plan:   Chronic cough: now resolved. Likely multifactorial.  Seems like GERD is a large contributor given vast improvement in the severity frequency of cough with now using pantoprazole in the morning and famotidine at night.  The addition of the famotidine seems to have helped.  Additionally, he describes clear postnasal drip symptoms and mucus collecting above his throat.  He has a longstanding history of allergic sinusitis.  Residual cough improved with treatment of this, antihistamine nasal  spray occasional sinus rinse.  This is now resolved.  Acute cough: 1-2 times a year.  Last a day.  No clear trigger despite multiple questioning and attempted identifying today.  Quickly resolved with hydrocodone cough syrup.  Given no clear etiology that I can intervene on, I recommend symptomatic management via his PCP.  Okay for small supply of cough syrup as needed, small supply prescribed today.  Encouraged further treatment, refills via his PCP.  Allergic rhinitis: Well-controlled, continue current regimen.   Return if symptoms worsen or fail to improve.   Karren Burly, MD 08/12/2023

## 2023-08-12 NOTE — Patient Instructions (Signed)
Nice to see you again  No changes to medication, baseline cough seems well-controlled on current regimen  Small supply of hydrocodone cough syrup to have on hand.  No follow-ups for now, I think your primary care office can handle this moving forward.  I will send them a copy of my note stating the same.  Return to clinic as needed

## 2023-08-30 NOTE — Progress Notes (Signed)
 Cardiology Office Note:   Date:  09/02/2023  ID:  Kevin Jones, DOB [birth year], MRN [id number] PCP:  Wyn Heater, MD  [facility] Providers Cardiologist:  Alyssa Backbone, MD Referring MD: Wyn Heater, MD  Chief Complaint/Reason for Referral: Cardiology follow-up for hypertension ASSESSMENT:    1. Type 2 diabetes mellitus with complication, without long-term current use of insulin  (HCC)   2. Hypertension associated with diabetes (HCC)   3. Hyperlipidemia associated with type 2 diabetes mellitus (HCC)   4. CKD (chronic kidney disease) stage 2, GFR 60-89 ml/min   5. BMI 40.0-44.9, adult (HCC)   6. Bifascicular block     PLAN:   In order of problems listed above: Type 2 diabetes mellitus: Continue aspirin  81 mg, atorvastatin  10 mg, Jardiance  10 mg, losartan  50 mg, continue Mounjaro 5 mg q. weekly. Hypertension: Blood pressure is well-controlled today. Hyperlipidemia: Check lipid panel and LFTs today; goal LDL is less than 70. CKD stage II: Continue Jardiance  10 mg and losartan  50 mg for renal protection. Elevated BMI: Continue Mounjaro 5 mg q. weekly Bifascicular block: Monitor.            Dispo:  Return in about 6 months (around 03/01/2024).      Medication Adjustments/Labs and Tests Ordered: Current medicines are reviewed at length with the patient today.  Concerns regarding medicines are outlined above.  The following changes have been made:  no change   Labs/tests ordered: Orders Placed This Encounter  Procedures   Lipid panel   Hepatic function panel    Medication Changes: No orders of the defined types were placed in this encounter.   Current medicines are reviewed at length with the patient today.  The patient does not have concerns regarding medicines.  I spent 32 minutes reviewing all clinical data during and prior to this visit including all relevant imaging studies, laboratories, clinical information from other health systems and prior notes from  both Cardiology and other specialties, interviewing the patient, conducting a complete physical examination, and coordinating care in order to formulate a comprehensive and personalized evaluation and treatment plan.   History of Present Illness:      FOCUSED PROBLEM LIST:   Type 2 diabetes On metformin  Hypertension Hyperlipidemia LP(a) 36.9 BMI 41 On Mounjaro Bifascicular block 1 AVB + RBBB CKD stage II   7/24:  The patient is a 78 y.o. male with the indicated medical history here for recommendations regarding hypertension.  On review of the patient's chart he had undergone orthopedic surgery in July of last year without any perioperative cardiovascular issues.  The patient was seen in his PCPs office in May.  He has had issues with difficult to control blood pressure.  He was actually started on prazosin which resulted in a low blood pressure.  This was stopped and on 2-week follow-up his blood pressure was noted to be 154/75 on a regimen including atenolol  100 mg daily and amlodipine  10 mg daily as well as losartan  100 mg daily.  The patient is doing well.  He denies any angina, dyspnea, severe bleeding or bruising, paroxysmal nocturnal dyspnea, orthopnea.  He is exercising every day on a stationary bike without any limitations.  He did bring his blood pressure log and and his pressures have been generally been in the 130s to 150s.  He was started on Mounjaro not too long ago and is lost about 8 pounds.  He does snore but he denies any daytime somnolence.  He is required no emergency room visits  or hospitalizations for cardiovascular issues.  Plan: Start chlorthalidone  25 mg daily, start Jardiance  10 mg daily, check lipid panel, LFTs, and LP(a).  2/25: In the interim the patient's LDL was found to be 54 and his LP(a) was not elevated.  He is creatinine increased to 1.3 on chlorthalidone  so this was stopped and his creatinine recovered.  He is doing very well.  He denies any shortness of breath  or chest pain.  He is lost 40 pounds while on Mounjaro.  He feels much better overall.  Denies any peripheral edema, paroxysmal nocturnal dyspnea, signs or symptoms of stroke, and denies any issues with any of his medications.  He denies any myalgias or arthralgias with atorvastatin .  He wants to lose another 40 pounds.  From a cardiovascular perspective he is without complaints.  He is unfortunately getting over a URI today and is contacted his PCP about being tested for COVID and the flu.          Current Medications: Current Meds  Medication Sig   acetaminophen  (TYLENOL ) 500 MG tablet Take 1,000 mg by mouth every 8 (eight) hours as needed for moderate pain.   alfuzosin  (UROXATRAL ) 10 MG 24 hr tablet Take 10 mg by mouth every evening.   allopurinol  (ZYLOPRIM ) 300 MG tablet Take 150 mg by mouth at bedtime.   amLODipine  (NORVASC ) 10 MG tablet Take 10 mg by mouth daily.   aspirin  EC 81 MG tablet Take 81 mg by mouth daily.   atenolol  (TENORMIN ) 100 MG tablet Take 100 mg by mouth daily.   atorvastatin  (LIPITOR) 10 MG tablet Take 10 mg by mouth every evening.   chlorpheniramine -HYDROcodone  (TUSSIONEX) 10-8 MG/5ML Take 5 mLs by mouth at bedtime as needed for cough.   empagliflozin  (JARDIANCE ) 10 MG TABS tablet Take 1 tablet (10 mg total) by mouth daily before breakfast.   famotidine  (PEPCID ) 20 MG tablet Take 20 mg by mouth every evening.   fluticasone  (FLONASE ) 50 MCG/ACT nasal spray USE 1 SPRAY IN EACH NOSTRIL DAILY   loratadine  (CLARITIN ) 10 MG tablet TAKE 1 TABLET DAILY (NEED APPOINTMENT FOR FURTHER REFILLS) (Patient taking differently: Take 10 mg by mouth daily as needed for allergies.)   losartan  (COZAAR ) 100 MG tablet Take 100 mg by mouth daily.   pantoprazole  (PROTONIX ) 40 MG tablet Take 40 mg by mouth daily.   tirzepatide (MOUNJARO) 5 MG/0.5ML Pen Inject 5 mg into the skin once a week.     Review of Systems:   Please see the history of present illness.    All other systems reviewed  and are negative.     EKGs/Labs/Other Test Reviewed:   EKG: EKG from July 2024 demonstrates sinus rhythm with first-degree AV block and right bundle branch block.  EKG Interpretation Date/Time:    Ventricular Rate:    PR Interval:    QRS Duration:    QT Interval:    QTC Calculation:   R Axis:      Text Interpretation:           Risk Assessment/Calculations:          Physical Exam:   VS:  BP 130/64   Pulse 85   Ht 5\' 9"  (1.753 m)   Wt 227 lb 9.6 oz (103.2 kg)   SpO2 96%   BMI 33.61 kg/m        Wt Readings from Last 3 Encounters:  09/02/23 227 lb 9.6 oz (103.2 kg)  08/12/23 231 lb 12.8 oz (105.1 kg)  02/01/23 255  lb 3.2 oz (115.8 kg)      GENERAL:  No apparent distress, AOx3 HEENT:  No carotid bruits, +2 carotid impulses, no scleral icterus CAR: RRR no murmurs, gallops, rubs, or thrills RES:  Clear to auscultation bilaterally ABD:  Soft, nontender, nondistended, positive bowel sounds x 4 VASC:  +2 radial pulses, +2 carotid pulses NEURO:  CN 2-12 grossly intact; motor and sensory grossly intact PSYCH:  No active depression or anxiety EXT:  No edema, ecchymosis, or cyanosis  Signed, Tasheba Henson K Bintou Lafata, MD  09/02/2023 8:56 AM    [facility] [address] [place], [place], [place]  [address] Phone: [phone]; Fax: [phone]   Note:  This document was prepared using Dragon voice recognition software and may include unintentional dictation errors.

## 2023-09-02 ENCOUNTER — Other Ambulatory Visit: Payer: Self-pay | Admitting: *Deleted

## 2023-09-02 ENCOUNTER — Encounter: Payer: Self-pay | Admitting: Internal Medicine

## 2023-09-02 ENCOUNTER — Ambulatory Visit: Payer: Medicare Other | Attending: Internal Medicine | Admitting: Internal Medicine

## 2023-09-02 VITALS — BP 130/64 | HR 85 | Ht 69.0 in | Wt 227.6 lb

## 2023-09-02 DIAGNOSIS — I452 Bifascicular block: Secondary | ICD-10-CM | POA: Insufficient documentation

## 2023-09-02 DIAGNOSIS — I152 Hypertension secondary to endocrine disorders: Secondary | ICD-10-CM | POA: Insufficient documentation

## 2023-09-02 DIAGNOSIS — E1169 Type 2 diabetes mellitus with other specified complication: Secondary | ICD-10-CM

## 2023-09-02 DIAGNOSIS — E785 Hyperlipidemia, unspecified: Secondary | ICD-10-CM | POA: Insufficient documentation

## 2023-09-02 DIAGNOSIS — Z6841 Body Mass Index (BMI) 40.0 and over, adult: Secondary | ICD-10-CM | POA: Diagnosis present

## 2023-09-02 DIAGNOSIS — E1159 Type 2 diabetes mellitus with other circulatory complications: Secondary | ICD-10-CM | POA: Insufficient documentation

## 2023-09-02 DIAGNOSIS — E118 Type 2 diabetes mellitus with unspecified complications: Secondary | ICD-10-CM | POA: Insufficient documentation

## 2023-09-02 DIAGNOSIS — N182 Chronic kidney disease, stage 2 (mild): Secondary | ICD-10-CM | POA: Insufficient documentation

## 2023-09-02 LAB — LIPID PANEL
Chol/HDL Ratio: 3.2 {ratio} (ref 0.0–5.0)
Cholesterol, Total: 120 mg/dL (ref 100–199)
HDL: 37 mg/dL — ABNORMAL LOW (ref >39)
LDL Chol Calc (NIH): 67 mg/dL (ref 0–99)
Triglycerides: 77 mg/dL (ref 0–149)
VLDL Cholesterol Cal: 16 mg/dL (ref 5–40)

## 2023-09-02 LAB — HEPATIC FUNCTION PANEL
ALT: 10 [IU]/L (ref 0–44)
AST: 14 [IU]/L (ref 0–40)
Albumin: 3.9 g/dL (ref 3.8–4.8)
Alkaline Phosphatase: 117 [IU]/L (ref 44–121)
Bilirubin Total: 0.6 mg/dL (ref 0.0–1.2)
Bilirubin, Direct: 0.2 mg/dL (ref 0.00–0.40)
Total Protein: 6.8 g/dL (ref 6.0–8.5)

## 2023-09-02 NOTE — Patient Instructions (Addendum)
 Medication Instructions:  No changes *If you need a refill on your cardiac medications before your next appointment, please call your pharmacy*   Lab Work: Today: lipids, liver function   Testing/Procedures: none   Follow-Up: At [facility], you and your health needs are our priority.  As part of our continuing mission to provide you with exceptional heart care, we have created designated Provider Care Teams.  These Care Teams include your primary Cardiologist (physician) and Advanced Practice Providers (APPs -  Physician Assistants and Nurse Practitioners) who all work together to provide you with the care you need, when you need it.   Your next appointment:   6 month(s)  Provider:   Marlyse Single, PA-C

## 2023-09-03 ENCOUNTER — Encounter: Payer: Self-pay | Admitting: Internal Medicine

## 2023-10-22 ENCOUNTER — Telehealth: Payer: Self-pay | Admitting: Internal Medicine

## 2023-10-22 NOTE — Telephone Encounter (Signed)
   Pre-operative Risk Assessment    Patient Name: Kevin Jones  DOB: 06-23-1946 MRN: [id number]   Date of last office visit: 09/02/2023 Date of next office visit: none   Request for Surgical Clearance    Procedure:   right total knee arthroplasty  Date of Surgery:  Clearance TBD                                Surgeon:  Dr. Malon Kindle Surgeon's Group or Practice Name:  [facility] Phone number:  [phone] [name] Fax number:  [phone]   Type of Clearance Requested:   - Medical  - Pharmacy:  Hold Aspirin need instruction   Type of Anesthesia:  Spinal   Additional requests/questions:    SignedRoyann Shivers   10/22/2023, 4:52 PM

## 2023-10-23 ENCOUNTER — Telehealth: Payer: Self-pay | Admitting: *Deleted

## 2023-10-23 NOTE — Telephone Encounter (Signed)
 Pt scheduled tele preop appt 11/13/23. Surgeon waiting on clearance. Med rec and consent are done.

## 2023-10-23 NOTE — Telephone Encounter (Signed)
 Pt scheduled tele preop appt 11/13/23. Surgeon waiting on clearance. Med rec and consent are done.     Patient Consent for Virtual Visit        Kevin Jones has provided verbal consent on 10/23/2023 for a virtual visit (video or telephone).   CONSENT FOR VIRTUAL VISIT FOR:  Kevin Jones  By participating in this virtual visit I agree to the following:  I hereby voluntarily request, consent and authorize  [facility] and its employed or contracted physicians, physician assistants, nurse practitioners or other licensed health care professionals (the Practitioner), to provide me with telemedicine health care services (the "Services") as deemed necessary by the treating Practitioner. I acknowledge and consent to receive the Services by the Practitioner via telemedicine. I understand that the telemedicine visit will involve communicating with the Practitioner through live audiovisual communication technology and the disclosure of certain medical information by electronic transmission. I acknowledge that I have been given the opportunity to request an in-person assessment or other available alternative prior to the telemedicine visit and am voluntarily participating in the telemedicine visit.  I understand that I have the right to withhold or withdraw my consent to the use of telemedicine in the course of my care at any time, without affecting my right to future care or treatment, and that the Practitioner or I may terminate the telemedicine visit at any time. I understand that I have the right to inspect all information obtained and/or recorded in the course of the telemedicine visit and may receive copies of available information for a reasonable fee.  I understand that some of the potential risks of receiving the Services via telemedicine include:  Delay or interruption in medical evaluation due to technological equipment failure or disruption; Information transmitted may not be sufficient (e.g.  poor resolution of images) to allow for appropriate medical decision making by the Practitioner; and/or  In rare instances, security protocols could fail, causing a breach of personal health information.  Furthermore, I acknowledge that it is my responsibility to provide information about my medical history, conditions and care that is complete and accurate to the best of my ability. I acknowledge that Practitioner's advice, recommendations, and/or decision may be based on factors not within their control, such as incomplete or inaccurate data provided by me or distortions of diagnostic images or specimens that may result from electronic transmissions. I understand that the practice of medicine is not an exact science and that Practitioner makes no warranties or guarantees regarding treatment outcomes. I acknowledge that a copy of this consent can be made available to me via my patient portal [facility] MyChart), or I can request a printed copy by calling the office of  [facility].    I understand that my insurance will be billed for this visit.   I have read or had this consent read to me. I understand the contents of this consent, which adequately explains the benefits and risks of the Services being provided via telemedicine.  I have been provided ample opportunity to ask questions regarding this consent and the Services and have had my questions answered to my satisfaction. I give my informed consent for the services to be provided through the use of telemedicine in my medical care

## 2023-10-23 NOTE — Telephone Encounter (Signed)
   Name: Kevin Jones  DOB: June 17, 1946  MRN: [id number]  Primary Cardiologist: Orbie Pyo, MD   Preoperative team, please contact this patient and set up a phone call appointment for further preoperative risk assessment. Please obtain consent and complete medication review. Thank you for your help.  I confirm that guidance regarding antiplatelet and oral anticoagulation therapy has been completed and, if necessary, noted below.  Per office protocol, if patient is without any new symptoms or concerns at the time of their virtual visit, he may hold Aspirin for 5-7 days prior to procedure. Please resume Aspirin as soon as possible postprocedure, at the discretion of the surgeon.    I also confirmed the patient resides in the state of [place]. As per [facility] telemedicine laws, the patient must reside in the state in which the provider is licensed.   Denyce Robert, NP 10/23/2023, 8:35 AM [facility]

## 2023-11-13 ENCOUNTER — Ambulatory Visit: Payer: PRIVATE HEALTH INSURANCE | Attending: Cardiovascular Disease | Admitting: Nurse Practitioner

## 2023-11-13 DIAGNOSIS — Z01810 Encounter for preprocedural cardiovascular examination: Secondary | ICD-10-CM | POA: Insufficient documentation

## 2023-11-13 NOTE — Progress Notes (Signed)
 Virtual Visit via Telephone Note   Because of Kevin Jones co-morbid illnesses, he is at least at moderate risk for complications without adequate follow up.  This format is felt to be most appropriate for this patient at this time.  Due to technical limitations with video connection (technology), today's appointment will be conducted as an audio only telehealth visit, and Kevin Jones verbally agreed to proceed in this manner.   All issues noted in this document were discussed and addressed.  No physical exam could be performed with this format.  Evaluation Performed:  Preoperative cardiovascular risk assessment _____________   Date:  11/13/2023   Patient ID:  Kevin Jones, DOB 10-06-1945, MRN [id number] Patient Location:  Home Provider location:   Office  Primary Care Provider:  Wyn Heater, MD Primary Cardiologist:  Arun K Thukkani, MD  Chief Complaint / Patient Profile   78 y.o. y/o male with a h/o bifascicular block, hypertension, hyperlipidemia, type 2 diabetes, CKD stage II, and obesity who is pending right total knee arthroplasty with Dr. Kenneth Peace of [facility] and presents today for telephonic preoperative cardiovascular risk assessment.  History of Present Illness    Kevin Jones is a 78 y.o. male who presents via audio/video conferencing for a telehealth visit today.  Pt was last seen in cardiology clinic on 09/02/2023 by Dr. Lorie Rook.  At that time Kevin Jones was doing well. The patient is now pending procedure as outlined above. Since his last visit, he has done well from a cardiac standpoint.   He denies chest pain, palpitations, dyspnea, pnd, orthopnea, n, v, dizziness, syncope, edema, weight gain, or early satiety. All other systems reviewed and are otherwise negative except as noted above.   Past Medical History    Past Medical History:  Diagnosis Date   Arthritis    Diabetes mellitus without complication (HCC)    Family history of adverse reaction to anesthesia     Daughter has severe nausea   GERD (gastroesophageal reflux disease)    Headache    from sinus congestion   Hyperlipidemia    Hypertension    Past Surgical History:  Procedure Laterality Date   CATARACT EXTRACTION W/ INTRAOCULAR LENS IMPLANT Bilateral    KNEE ARTHROSCOPY Left within last 5 yrs.   NASAL SEPTOPLASTY W/ TURBINOPLASTY Bilateral 11/09/2016   Procedure: NASAL SEPTOPLASTY WITH BILATERAL TURBINATE REDUCTION;  Surgeon: Ammon Bales, MD;  Location: [facility] OR;  Service: ENT;  Laterality: Bilateral;   REVERSE SHOULDER ARTHROPLASTY Right 02/16/2022   Procedure: REVERSE SHOULDER ARTHROPLASTY;  Surgeon: Winston Hawking, MD;  Location: [facility] ORS;  Service: Orthopedics;  Laterality: Right;  with ISB   thumb surgery, left  Left    VASECTOMY      Allergies  Allergies  Allergen Reactions   Compazine [Prochlorperazine Edisylate] Rash and Other (See Comments)    Muscle contractions   Chlorthalidone  Other (See Comments)    Muscle spasms   Sulfa Antibiotics Rash   Terazosin Rash    Home Medications    Prior to Admission medications   Medication Sig Start Date End Date Taking? Authorizing Provider  acetaminophen  (TYLENOL ) 500 MG tablet Take 1,000 mg by mouth every 8 (eight) hours as needed for moderate pain.    [provider]  alfuzosin  (UROXATRAL ) 10 MG 24 hr tablet Take 10 mg by mouth every evening. 07/11/19   [provider]  allopurinol  (ZYLOPRIM ) 300 MG tablet Take 150 mg by mouth at bedtime.    [provider]  amLODipine  (NORVASC ) 10  MG tablet Take 10 mg by mouth daily.    [provider]  aspirin  EC 81 MG tablet Take 81 mg by mouth daily.    [provider]  atenolol  (TENORMIN ) 100 MG tablet Take 100 mg by mouth daily.    [provider]  atorvastatin  (LIPITOR) 10 MG tablet Take 10 mg by mouth every evening. 04/28/18   [provider]  chlorpheniramine -HYDROcodone  (TUSSIONEX) 10-8 MG/5ML Take 5 mLs by mouth at  bedtime as needed for cough. 08/12/23   Hunsucker, Archer Kobs, MD  empagliflozin  (JARDIANCE ) 10 MG TABS tablet Take 1 tablet (10 mg total) by mouth daily before breakfast. 07/25/23   Thukkani, Arun K, MD  famotidine  (PEPCID ) 20 MG tablet Take 20 mg by mouth every evening.    [provider]  fluticasone  (FLONASE ) 50 MCG/ACT nasal spray USE 1 SPRAY IN EACH NOSTRIL DAILY 04/12/23   Hunsucker, Archer Kobs, MD  HYDROcodone -acetaminophen  (NORCO/VICODIN) 5-325 MG tablet Take 1 tablet by mouth every 6 (six) hours. 10/06/23   [provider]  loratadine  (CLARITIN ) 10 MG tablet TAKE 1 TABLET DAILY (NEED APPOINTMENT FOR FURTHER REFILLS) Patient taking differently: Take 10 mg by mouth daily as needed for allergies. 01/17/22   Hunsucker, Archer Kobs, MD  losartan  (COZAAR ) 100 MG tablet Take 100 mg by mouth daily.    [provider]  pantoprazole  (PROTONIX ) 40 MG tablet Take 40 mg by mouth daily.    [provider]  tirzepatide Florence Hunt) 5 MG/0.5ML Pen Inject 5 mg into the skin once a week.    [provider]    Physical Exam    Vital Signs:  Kevin Jones does not have vital signs available for review today.  Given telephonic nature of communication, physical exam is limited. AAOx3. NAD. Normal affect.  Speech and respirations are unlabored.  Accessory Clinical Findings    None  Assessment & Plan    1.  Preoperative Cardiovascular Risk Assessment:  According to the Revised Cardiac Risk Index (RCRI), his Perioperative Risk of Major Cardiac Event is (%): 0.4. His Functional Capacity in METs is: 6.36 according to the Duke Activity Status Index (DASI). Therefore, based on ACC/AHA guidelines, patient would be at acceptable risk for the planned procedure without further cardiovascular testing.   The patient was advised that if he develops new symptoms prior to surgery to contact our office to arrange for a follow-up visit, and he verbalized understanding.  Regarding ASA  therapy, we recommend continuation of ASA throughout the perioperative period. However, if the surgeon feels that cessation of ASA is required in the perioperative period, it may be stopped 5-7 days prior to surgery with a plan to resume it as soon as felt to be feasible from a surgical standpoint in the post-operative period.  A copy of this note will be routed to requesting surgeon.  Time:   Today, I have spent 5 minutes with the patient with telehealth technology discussing medical history, symptoms, and management plan.     Kevin Norton, NP  11/13/2023, 8:56 AM

## 2023-12-08 ENCOUNTER — Other Ambulatory Visit: Payer: Self-pay | Admitting: Internal Medicine

## 2023-12-08 DIAGNOSIS — E118 Type 2 diabetes mellitus with unspecified complications: Secondary | ICD-10-CM

## 2024-04-01 ENCOUNTER — Other Ambulatory Visit: Payer: Self-pay | Admitting: Pulmonary Disease

## 2024-04-01 DIAGNOSIS — R059 Cough, unspecified: Secondary | ICD-10-CM

## 2024-04-01 DIAGNOSIS — J310 Chronic rhinitis: Secondary | ICD-10-CM

## 2024-04-17 ENCOUNTER — Telehealth: Payer: Self-pay

## 2024-04-17 NOTE — Telephone Encounter (Signed)
   Pre-operative Risk Assessment    Patient Name: Kevin Jones  DOB: 03/30/1946 MRN: [id number]   Date of last office visit: 09/02/23 Date of next office visit: NA   Request for Surgical Clearance    Procedure:  Colonoscopy  history of adenomatous polyps  Date of Surgery:  Clearance 05/14/24                               Surgeon:  Dr. Estelita Manas Surgeon's Group or Practice Name:  [facility] Phone number:  [phone] Fax number:  [phone]   Type of Clearance Requested:   - Medical  None Indicated   Type of Anesthesia:  Not Indicated   Additional requests/questions:    Kevin Jones   04/17/2024, 5:22 PM

## 2024-04-20 NOTE — Telephone Encounter (Signed)
 Patient was returning call. Please advise ?

## 2024-04-20 NOTE — Telephone Encounter (Signed)
   Name: Kevin Jones  DOB: 03/08/1946  MRN: [id number]  Primary Cardiologist: Arun K Thukkani, MD Last OV: 09/02/23  Preoperative team, please contact this patient and set up a phone call appointment for further preoperative risk assessment. Please obtain consent and complete medication review. Thank you for your help.  I confirm that guidance regarding antiplatelet and oral anticoagulation therapy has been completed and, if necessary, noted below. None requested.   I also confirmed the patient resides in the state of [place] . As per [facility] Medical Board telemedicine laws, the patient must reside in the state in which the provider is licensed.  [name] D [name], NP 04/20/2024, 8:15 AM [facility]

## 2024-04-20 NOTE — Telephone Encounter (Signed)
 Left message to call back to schedule tele pre op appt.

## 2024-04-20 NOTE — Telephone Encounter (Signed)
 Error

## 2024-04-21 ENCOUNTER — Telehealth (HOSPITAL_BASED_OUTPATIENT_CLINIC_OR_DEPARTMENT_OTHER): Payer: Self-pay | Admitting: *Deleted

## 2024-04-21 NOTE — Telephone Encounter (Signed)
 Pt has been scheduled tele preop appt 04/29/24. Med rec and consent are done.     Patient Consent for Virtual Visit        Kevin Jones has provided verbal consent on 04/21/2024 for a virtual visit (video or telephone).   CONSENT FOR VIRTUAL VISIT FOR:  Kevin Jones  By participating in this virtual visit I agree to the following:  I hereby voluntarily request, consent and authorize [facility] and its employed or contracted physicians, physician assistants, nurse practitioners or other licensed health care professionals (the Practitioner), to provide me with telemedicine health care services (the "Services) as deemed necessary by the treating Practitioner. I acknowledge and consent to receive the Services by the Practitioner via telemedicine. I understand that the telemedicine visit will involve communicating with the Practitioner through live audiovisual communication technology and the disclosure of certain medical information by electronic transmission. I acknowledge that I have been given the opportunity to request an in-person assessment or other available alternative prior to the telemedicine visit and am voluntarily participating in the telemedicine visit.  I understand that I have the right to withhold or withdraw my consent to the use of telemedicine in the course of my care at any time, without affecting my right to future care or treatment, and that the Practitioner or I may terminate the telemedicine visit at any time. I understand that I have the right to inspect all information obtained and/or recorded in the course of the telemedicine visit and may receive copies of available information for a reasonable fee.  I understand that some of the potential risks of receiving the Services via telemedicine include:  Delay or interruption in medical evaluation due to technological equipment failure or disruption; Information transmitted may not be sufficient (e.g. poor resolution of  images) to allow for appropriate medical decision making by the Practitioner; and/or  In rare instances, security protocols could fail, causing a breach of personal health information.  Furthermore, I acknowledge that it is my responsibility to provide information about my medical history, conditions and care that is complete and accurate to the best of my ability. I acknowledge that Practitioner's advice, recommendations, and/or decision may be based on factors not within their control, such as incomplete or inaccurate data provided by me or distortions of diagnostic images or specimens that may result from electronic transmissions. I understand that the practice of medicine is not an exact science and that Practitioner makes no warranties or guarantees regarding treatment outcomes. I acknowledge that a copy of this consent can be made available to me via my patient portal [facility] MyChart), or I can request a printed copy by calling the office of [facility].    I understand that my insurance will be billed for this visit.   I have read or had this consent read to me. I understand the contents of this consent, which adequately explains the benefits and risks of the Services being provided via telemedicine.  I have been provided ample opportunity to ask questions regarding this consent and the Services and have had my questions answered to my satisfaction. I give my informed consent for the services to be provided through the use of telemedicine in my medical care

## 2024-04-21 NOTE — Telephone Encounter (Signed)
 Pt has been scheduled tele preop appt 04/29/24. Med rec and consent are done.

## 2024-04-29 ENCOUNTER — Ambulatory Visit: Attending: Cardiology

## 2024-04-29 DIAGNOSIS — Z01810 Encounter for preprocedural cardiovascular examination: Secondary | ICD-10-CM | POA: Diagnosis not present

## 2024-04-29 NOTE — Progress Notes (Signed)
 Virtual Visit via Telephone Note   Because of [name] co-morbid illnesses, he is at least at moderate risk for complications without adequate follow up.  This format is felt to be most appropriate for this patient at this time.  Due to technical limitations with video connection (technology), today's appointment will be conducted as an audio only telehealth visit, and [name] verbally agreed to proceed in this manner.   All issues noted in this document were discussed and addressed.  No physical exam could be performed with this format.  Evaluation Performed:  Preoperative cardiovascular risk assessment _____________   Date:  04/29/2024   Patient ID:  Kevin Jones, DOB [birth year], MRN [id number] Patient Location:  Home Provider location:   Office  Primary Care Provider:  Nanci Senior, MD Primary Cardiologist:  Arun K Thukkani, MD  Chief Complaint / Patient Profile   78 y.o. y/o male with a h/o type 2 diabetes, hypertension, hyperlipidemia, CKD stage II who is pending colonoscopy and presents today for telephonic preoperative cardiovascular risk assessment.  History of Present Illness    Kevin Jones is a 78 y.o. male who presents via audio/video conferencing for a telehealth visit today.  Pt was last seen in cardiology clinic on 09/02/2023 by Dr.Thukkani.  At that time Burley Kopka was doing well .  The patient is now pending procedure as outlined above. Since his last visit, he continues to do well from a cardiac standpoint.  Today he denies chest pain, shortness of breath, lower extremity edema, fatigue, palpitations, melena, hematuria, hemoptysis, diaphoresis, weakness, presyncope, syncope, orthopnea, and PND.    Past Medical History    Past Medical History:  Diagnosis Date   Arthritis    Diabetes mellitus without complication (HCC)    Family history of adverse reaction to anesthesia    Daughter has severe nausea   GERD (gastroesophageal reflux disease)    Headache     from sinus congestion   Hyperlipidemia    Hypertension    Past Surgical History:  Procedure Laterality Date   CATARACT EXTRACTION W/ INTRAOCULAR LENS IMPLANT Bilateral    KNEE ARTHROSCOPY Left within last 5 yrs.   NASAL SEPTOPLASTY W/ TURBINOPLASTY Bilateral 11/09/2016   Procedure: NASAL SEPTOPLASTY WITH BILATERAL TURBINATE REDUCTION;  Surgeon: Alm Bouche, MD;  Location: [facility] OR;  Service: ENT;  Laterality: Bilateral;   REVERSE SHOULDER ARTHROPLASTY Right 02/16/2022   Procedure: REVERSE SHOULDER ARTHROPLASTY;  Surgeon: Kay Kemps, MD;  Location: [facility] ORS;  Service: Orthopedics;  Laterality: Right;  with ISB   thumb surgery, left  Left    VASECTOMY      Allergies  Allergies  Allergen Reactions   Compazine [Prochlorperazine Edisylate] Rash and Other (See Comments)    Muscle contractions   Chlorthalidone  Other (See Comments)    Muscle spasms   Sulfa Antibiotics Rash   Terazosin Rash    Home Medications    Prior to Admission medications   Medication Sig Start Date End Date Taking? Authorizing Provider  acetaminophen  (TYLENOL ) 500 MG tablet Take 1,000 mg by mouth every 8 (eight) hours as needed for moderate pain.    [provider]  alfuzosin  (UROXATRAL ) 10 MG 24 hr tablet Take 10 mg by mouth every evening. 07/11/19   [provider]  allopurinol  (ZYLOPRIM ) 300 MG tablet Take 150 mg by mouth at bedtime.    [provider]  amLODipine  (NORVASC ) 10 MG tablet Take 10 mg by mouth daily.    [provider]  aspirin  EC 81  MG tablet Take 81 mg by mouth daily.    [provider]  atenolol  (TENORMIN ) 100 MG tablet Take 100 mg by mouth daily.    [provider]  atorvastatin  (LIPITOR) 10 MG tablet Take 10 mg by mouth every evening. 04/28/18   [provider]  chlorpheniramine -HYDROcodone  (TUSSIONEX) 10-8 MG/5ML Take 5 mLs by mouth at bedtime as needed for cough. 08/12/23   Hunsucker, Donnice SAUNDERS, MD  famotidine  (PEPCID ) 20 MG  tablet Take 20 mg by mouth every evening.    [provider]  fluticasone  (FLONASE ) 50 MCG/ACT nasal spray USE 1 SPRAY IN EACH NOSTRIL DAILY 04/12/23   Hunsucker, Donnice SAUNDERS, MD  HYDROcodone -acetaminophen  (NORCO/VICODIN) 5-325 MG tablet Take 1 tablet by mouth every 6 (six) hours. 10/06/23   [provider]  JARDIANCE  10 MG TABS tablet TAKE 1 TABLET BY MOUTH DAILY BEFORE BREAKFAST. 12/09/23   Thukkani, Arun K, MD  loratadine  (CLARITIN ) 10 MG tablet TAKE 1 TABLET DAILY (NEED APPOINTMENT FOR FURTHER REFILLS) Patient taking differently: Take 10 mg by mouth daily as needed for allergies. 01/17/22   Hunsucker, Donnice SAUNDERS, MD  losartan  (COZAAR ) 100 MG tablet Take 100 mg by mouth daily.    [provider]  pantoprazole  (PROTONIX ) 40 MG tablet Take 40 mg by mouth daily.    [provider]  tirzepatide CLOYDE) 5 MG/0.5ML Pen Inject 5 mg into the skin once a week.    [provider]    Physical Exam    Vital Signs:  [name] does not have vital signs available for review today.  Given telephonic nature of communication, physical exam is limited. AAOx3. NAD. Normal affect.  Speech and respirations are unlabored.  Accessory Clinical Findings    None  Assessment & Plan    1.  Preoperative Cardiovascular Risk Assessment:Colonoscopy  history of adenomatous polyps   Date of Surgery:  Clearance 05/14/24                                Surgeon:  Dr. Estelita Manas Surgeon's Group or Practice Name:  [facility] Phone number:  [phone] Fax number:  [phone]      Primary Cardiologist: Arun K Thukkani, MD  Chart reviewed as part of pre-operative protocol coverage. Given past medical history and time since last visit, based on ACC/AHA guidelines, Kiing Deakin would be at acceptable risk for the planned procedure without further cardiovascular testing.   Patient was advised that if he develops new symptoms prior to surgery to contact our office  to arrange a follow-up appointment.  He verbalized understanding.  I will route this recommendation to the requesting party via Epic fax function and remove from pre-op pool.       Time:   Today, I have spent 5 minutes with the patient with telehealth technology discussing medical history, symptoms, and management plan. I spent 10 minutes reviewing patient's past cardiac history and cardiac medications.     Josefa CHRISTELLA Beauvais, NP  04/29/2024, 7:17 AM

## 2024-06-12 ENCOUNTER — Emergency Department (HOSPITAL_BASED_OUTPATIENT_CLINIC_OR_DEPARTMENT_OTHER)
Admission: EM | Admit: 2024-06-12 | Discharge: 2024-06-12 | Disposition: A | Discharge status: Home / Self Care | Attending: Emergency Medicine | Admitting: Emergency Medicine

## 2024-06-12 ENCOUNTER — Encounter (HOSPITAL_BASED_OUTPATIENT_CLINIC_OR_DEPARTMENT_OTHER): Payer: Self-pay

## 2024-06-12 ENCOUNTER — Other Ambulatory Visit: Payer: Self-pay

## 2024-06-12 DIAGNOSIS — R899 Unspecified abnormal finding in specimens from other organs, systems and tissues: Secondary | ICD-10-CM

## 2024-06-12 DIAGNOSIS — R944 Abnormal results of kidney function studies: Secondary | ICD-10-CM | POA: Diagnosis not present

## 2024-06-12 DIAGNOSIS — N189 Chronic kidney disease, unspecified: Secondary | ICD-10-CM | POA: Diagnosis not present

## 2024-06-12 DIAGNOSIS — R799 Abnormal finding of blood chemistry, unspecified: Secondary | ICD-10-CM | POA: Diagnosis present

## 2024-06-12 DIAGNOSIS — I129 Hypertensive chronic kidney disease with stage 1 through stage 4 chronic kidney disease, or unspecified chronic kidney disease: Secondary | ICD-10-CM | POA: Diagnosis not present

## 2024-06-12 DIAGNOSIS — Z79899 Other long term (current) drug therapy: Secondary | ICD-10-CM | POA: Diagnosis not present

## 2024-06-12 DIAGNOSIS — Z7982 Long term (current) use of aspirin: Secondary | ICD-10-CM | POA: Diagnosis not present

## 2024-06-12 DIAGNOSIS — E1122 Type 2 diabetes mellitus with diabetic chronic kidney disease: Secondary | ICD-10-CM | POA: Insufficient documentation

## 2024-06-12 LAB — BASIC METABOLIC PANEL WITH GFR
Anion gap: 12 (ref 5–15)
BUN: 30 mg/dL — ABNORMAL HIGH (ref 8–23)
CO2: 23 mmol/L (ref 22–32)
Calcium: 9.1 mg/dL (ref 8.9–10.3)
Chloride: 106 mmol/L (ref 98–111)
Creatinine, Ser: 0.99 mg/dL (ref 0.61–1.24)
GFR, Estimated: >60 mL/min (ref >60)
Glucose, Bld: 94 mg/dL (ref 70–99)
Potassium: 3.8 mmol/L (ref 3.5–5.1)
Sodium: 142 mmol/L (ref 135–145)

## 2024-06-12 LAB — CBC WITH DIFFERENTIAL/PLATELET
Abs Immature Granulocytes: 0.01 K/uL (ref 0.00–0.07)
Basophils Absolute: 0 K/uL (ref 0.0–0.1)
Basophils Relative: 1 %
Eosinophils Absolute: 0.5 K/uL (ref 0.0–0.5)
Eosinophils Relative: 8 %
HCT: 43.1 % (ref 39.0–52.0)
Hemoglobin: 14.5 g/dL (ref 13.0–17.0)
Immature Granulocytes: 0 %
Lymphocytes Relative: 30 %
Lymphs Abs: 1.7 K/uL (ref 0.7–4.0)
MCH: 28.7 pg (ref 26.0–34.0)
MCHC: 33.6 g/dL (ref 30.0–36.0)
MCV: 85.2 fL (ref 80.0–100.0)
Monocytes Absolute: 0.8 K/uL (ref 0.1–1.0)
Monocytes Relative: 13 %
Neutro Abs: 2.9 K/uL (ref 1.7–7.7)
Neutrophils Relative %: 48 %
Platelets: 172 K/uL (ref 150–400)
RBC: 5.06 MIL/uL (ref 4.22–5.81)
RDW: 14.5 % (ref 11.5–15.5)
WBC: 5.9 K/uL (ref 4.0–10.5)
nRBC: 0 % (ref 0.0–0.2)

## 2024-06-12 LAB — OSMOLALITY: Osmolality: 303 mosm/kg — ABNORMAL HIGH (ref 275–295)

## 2024-06-12 NOTE — Discharge Instructions (Signed)
 We rechecked your blood work today and your sodium level is normal Need to follow-up with your primary doctor Take Tylenol  at home for headaches Return to the emergency department for severe headaches or any other concerns

## 2024-06-12 NOTE — ED Triage Notes (Signed)
 Pt c/o hyponatremia (126), advises that PCP did labs Wednesday. States he went to PCP for routine checkup, advises HA x1wk, denies other symptoms

## 2024-06-12 NOTE — ED Provider Notes (Signed)
 [place] [facility] DEPART[facility] AT [facility] Provider Note   CSN: [id number] Arrival date & time: 06/12/24  1757     Patient presents with: Abnormal Lab   Kevin Jones is a 78 y.o. male.  With a history of CKD hypertension hyperlipidemia and type 2 diabetes not insulin -dependent who presents to the ED for hyponatremia.  Outpatient labs drawn 2 days ago which showed hyponatremia of 126.  Patient was called instructed to go to the ED for further evaluation.  Reports dull headache for the last week otherwise asymptomatic.  No weakness fatigue confusion fevers chills chest pain shortness of breath or other complaint at this time.  Is compliant on Mounjaro and Jardiance     Abnormal Lab      Prior to Admission medications   Medication Sig Start Date End Date Taking? Authorizing Provider  acetaminophen  (TYLENOL ) 500 MG tablet Take 1,000 mg by mouth every 8 (eight) hours as needed for moderate pain.    [provider]  alfuzosin  (UROXATRAL ) 10 MG 24 hr tablet Take 10 mg by mouth every evening. 07/11/19   [provider]  allopurinol  (ZYLOPRIM ) 300 MG tablet Take 150 mg by mouth at bedtime.    [provider]  amLODipine  (NORVASC ) 10 MG tablet Take 10 mg by mouth daily.    [provider]  aspirin  EC 81 MG tablet Take 81 mg by mouth daily.    [provider]  atenolol  (TENORMIN ) 100 MG tablet Take 100 mg by mouth daily.    [provider]  atorvastatin  (LIPITOR) 10 MG tablet Take 10 mg by mouth every evening. 04/28/18   [provider]  chlorpheniramine -HYDROcodone  (TUSSIONEX) 10-8 MG/5ML Take 5 mLs by mouth at bedtime as needed for cough. 08/12/23   Hunsucker, Donnice SAUNDERS, MD  famotidine  (PEPCID ) 20 MG tablet Take 20 mg by mouth every evening.    [provider]  fluticasone  (FLONASE ) 50 MCG/ACT nasal spray USE 1 SPRAY IN EACH NOSTRIL DAILY 04/12/23   Hunsucker, Donnice SAUNDERS, MD  HYDROcodone -acetaminophen   (NORCO/VICODIN) 5-325 MG tablet Take 1 tablet by mouth every 6 (six) hours. 10/06/23   [provider]  JARDIANCE  10 MG TABS tablet TAKE 1 TABLET BY MOUTH DAILY BEFORE BREAKFAST. 12/09/23   Thukkani, Arun K, MD  loratadine  (CLARITIN ) 10 MG tablet TAKE 1 TABLET DAILY (NEED APPOINTMENT FOR FURTHER REFILLS) Patient taking differently: Take 10 mg by mouth daily as needed for allergies. 01/17/22   Hunsucker, Donnice SAUNDERS, MD  losartan  (COZAAR ) 100 MG tablet Take 100 mg by mouth daily.    [provider]  pantoprazole  (PROTONIX ) 40 MG tablet Take 40 mg by mouth daily.    [provider]  tirzepatide CLOYDE) 5 MG/0.5ML Pen Inject 5 mg into the skin once a week.    [provider]    Allergies: Compazine [prochlorperazine edisylate], Chlorthalidone , Sulfa antibiotics, and Terazosin    Review of Systems  Updated Vital Signs BP (!) 158/70   Pulse 79   Temp 98.4 F (36.9 C)   Resp 16   SpO2 98%   Physical Exam Vitals and nursing note reviewed.  HENT:     Head: Normocephalic and atraumatic.  Eyes:     Pupils: Pupils are equal, round, and reactive to light.  Cardiovascular:     Rate and Rhythm: Normal rate and regular rhythm.  Pulmonary:     Effort: Pulmonary effort is normal.     Breath sounds: Normal breath sounds.  Abdominal:     Palpations:  Abdomen is soft.     Tenderness: There is no abdominal tenderness.  Skin:    General: Skin is warm and dry.  Neurological:     General: No focal deficit present.     Mental Status: He is alert and oriented to person, place, and time.     Sensory: No sensory deficit.     Motor: No weakness.  Psychiatric:        Mood and Affect: Mood normal.     (all labs ordered are listed, but only abnormal results are displayed) Labs Reviewed  BASIC METABOLIC PANEL WITH GFR - Abnormal; Notable for the following components:      Result Value   BUN 30 (*)    All other components within normal limits  CBC WITH  DIFFERENTIAL/PLATELET  OSMOLALITY    EKG: EKG Interpretation Date/Time:  Friday June 12 2024 18:36:11 EST Ventricular Rate:  81 PR Interval:  299 QRS Duration:  147 QT Interval:  393 QTC Calculation: 457 R Axis:   -35  Text Interpretation: Sinus rhythm Prolonged PR interval Right bundle branch block Inferior infarct, old Baseline wander in lead(s) V3 Confirmed by Pamella Sharper [id number]) on 06/12/2024 8:22:02 PM  Radiology: No results found.   Procedures   Medications Ordered in the ED - No data to display  Clinical Course as of 06/12/24 2022  Fri Jun 12, 2024  2021 Normal sodium of 142 tonight.  Remainder of laboratory workup unremarkable.  Elevated BUN consistent with prior laboratory draws.  Patient remained stable will discharge with instruction for close PCP follow-up. [MP]    Clinical Course User Index [MP] Pamella Sharper LABOR, DO                                 Medical Decision Making 78 year old male with history as above presenting for hyponatremia on outpatient labs from 2 days ago.  126 sodium.  Vague headache but otherwise asymptomatic.  No neurologic deficits or altered mental status.  No nausea vomiting recent illness.  Will repeat laboratory workup and EKG here tonight to determine next step  Amount and/or Complexity of Data Reviewed Labs: ordered.        Final diagnoses:  Abnormal laboratory test    ED Discharge Orders     None          Pamella Sharper LABOR, DO 06/12/24 2022

## 2024-06-16 ENCOUNTER — Encounter: Payer: Self-pay | Admitting: Pulmonary Disease

## 2024-06-16 DIAGNOSIS — J310 Chronic rhinitis: Secondary | ICD-10-CM

## 2024-06-16 DIAGNOSIS — R059 Cough, unspecified: Secondary | ICD-10-CM

## 2024-06-17 MED ORDER — FLUTICASONE PROPIONATE 50 MCG/ACT NA SUSP: 1.0000 | Freq: Every day | NASAL | 2 refills | Status: AC

## 2024-07-08 ENCOUNTER — Other Ambulatory Visit: Payer: Self-pay | Admitting: Internal Medicine

## 2024-07-08 DIAGNOSIS — E118 Type 2 diabetes mellitus with unspecified complications: Secondary | ICD-10-CM

## 2024-07-09 MED ORDER — EMPAGLIFLOZIN 10 MG PO TABS: 10.0000 mg | ORAL_TABLET | Freq: Every day | ORAL | 0 refills | Status: AC

## 2024-10-09 ENCOUNTER — Ambulatory Visit: Admitting: Physician Assistant
